# Patient Record
Sex: Female | Born: 1985 | Race: Black or African American | Hispanic: No | Marital: Single | Smoking: Former smoker
Health system: Southern US, Community
[De-identification: ages and names within clinical notes are randomized; demographics above are authoritative.]

## PROBLEM LIST (undated history)

## (undated) ENCOUNTER — Inpatient Hospital Stay (HOSPITAL_COMMUNITY): Payer: Self-pay

## (undated) DIAGNOSIS — R519 Headache, unspecified: Secondary | ICD-10-CM

## (undated) DIAGNOSIS — Z789 Other specified health status: Secondary | ICD-10-CM

## (undated) DIAGNOSIS — Z8619 Personal history of other infectious and parasitic diseases: Secondary | ICD-10-CM

## (undated) DIAGNOSIS — R51 Headache: Secondary | ICD-10-CM

## (undated) HISTORY — PX: NO PAST SURGERIES: SHX2092

## (undated) HISTORY — DX: Personal history of other infectious and parasitic diseases: Z86.19

## (undated) HISTORY — PX: OTHER SURGICAL HISTORY: SHX169

## (undated) HISTORY — DX: Headache, unspecified: R51.9

## (undated) HISTORY — DX: Headache: R51

---
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
## 2004-03-28 ENCOUNTER — Emergency Department (HOSPITAL_COMMUNITY): Admission: EM | Admit: 2004-03-28 | Discharge: 2004-03-28 | Payer: Self-pay | Admitting: Emergency Medicine

## 2005-06-02 ENCOUNTER — Emergency Department (HOSPITAL_COMMUNITY): Admission: EM | Admit: 2005-06-02 | Discharge: 2005-06-02 | Payer: Self-pay | Admitting: Family Medicine

## 2005-06-04 ENCOUNTER — Emergency Department (HOSPITAL_COMMUNITY): Admission: EM | Admit: 2005-06-04 | Discharge: 2005-06-04 | Payer: Self-pay | Admitting: Emergency Medicine

## 2005-12-22 ENCOUNTER — Inpatient Hospital Stay (HOSPITAL_COMMUNITY): Admission: AD | Admit: 2005-12-22 | Discharge: 2005-12-22 | Payer: Self-pay | Admitting: Obstetrics and Gynecology

## 2006-06-24 ENCOUNTER — Inpatient Hospital Stay (HOSPITAL_COMMUNITY): Admission: AD | Admit: 2006-06-24 | Discharge: 2006-06-27 | Payer: Self-pay | Admitting: Obstetrics and Gynecology

## 2007-09-08 ENCOUNTER — Emergency Department (HOSPITAL_COMMUNITY): Admission: EM | Admit: 2007-09-08 | Discharge: 2007-09-08 | Payer: Self-pay | Admitting: Family Medicine

## 2009-01-20 ENCOUNTER — Emergency Department (HOSPITAL_COMMUNITY): Admission: EM | Admit: 2009-01-20 | Discharge: 2009-01-21 | Payer: Self-pay | Admitting: Emergency Medicine

## 2009-10-11 ENCOUNTER — Inpatient Hospital Stay (HOSPITAL_COMMUNITY): Admission: AD | Admit: 2009-10-11 | Discharge: 2009-10-11 | Payer: Self-pay | Admitting: Family Medicine

## 2010-10-29 LAB — WET PREP, GENITAL
Trich, Wet Prep: NONE SEEN
Yeast Wet Prep HPF POC: NONE SEEN

## 2010-10-29 LAB — GC/CHLAMYDIA PROBE AMP, GENITAL
Chlamydia, DNA Probe: NEGATIVE
GC Probe Amp, Genital: NEGATIVE

## 2010-12-15 ENCOUNTER — Emergency Department (HOSPITAL_COMMUNITY): Payer: No Typology Code available for payment source

## 2010-12-15 ENCOUNTER — Emergency Department (HOSPITAL_COMMUNITY)
Admission: EM | Admit: 2010-12-15 | Discharge: 2010-12-16 | Disposition: A | Discharge status: Home / Self Care | Payer: No Typology Code available for payment source | Attending: Emergency Medicine | Admitting: Emergency Medicine

## 2010-12-15 DIAGNOSIS — IMO0002 Reserved for concepts with insufficient information to code with codable children: Secondary | ICD-10-CM | POA: Insufficient documentation

## 2010-12-15 DIAGNOSIS — Y998 Other external cause status: Secondary | ICD-10-CM | POA: Insufficient documentation

## 2010-12-15 DIAGNOSIS — M25519 Pain in unspecified shoulder: Secondary | ICD-10-CM | POA: Insufficient documentation

## 2010-12-15 DIAGNOSIS — Y9301 Activity, walking, marching and hiking: Secondary | ICD-10-CM | POA: Insufficient documentation

## 2010-12-15 DIAGNOSIS — S0990XA Unspecified injury of head, initial encounter: Secondary | ICD-10-CM | POA: Insufficient documentation

## 2010-12-15 DIAGNOSIS — M542 Cervicalgia: Secondary | ICD-10-CM | POA: Insufficient documentation

## 2010-12-15 DIAGNOSIS — R079 Chest pain, unspecified: Secondary | ICD-10-CM | POA: Insufficient documentation

## 2010-12-15 DIAGNOSIS — S40019A Contusion of unspecified shoulder, initial encounter: Secondary | ICD-10-CM | POA: Insufficient documentation

## 2010-12-15 LAB — DIFFERENTIAL
Basophils Absolute: 0 10*3/uL (ref 0.0–0.1)
Basophils Relative: 0 % (ref 0–1)
Lymphocytes Relative: 44 % (ref 12–46)
Neutro Abs: 2.6 10*3/uL (ref 1.7–7.7)
Neutrophils Relative %: 46 % (ref 43–77)

## 2010-12-15 LAB — CBC
HCT: 38.3 % (ref 36.0–46.0)
Hemoglobin: 13.4 g/dL (ref 12.0–15.0)
RBC: 4.78 MIL/uL (ref 3.87–5.11)
WBC: 5.7 10*3/uL (ref 4.0–10.5)

## 2010-12-15 LAB — COMPREHENSIVE METABOLIC PANEL
ALT: 11 U/L (ref 0–35)
AST: 26 U/L (ref 0–37)
Alkaline Phosphatase: 52 U/L (ref 39–117)
CO2: 26 mEq/L (ref 19–32)
Calcium: 9.1 mg/dL (ref 8.4–10.5)
Chloride: 104 mEq/L (ref 96–112)
GFR calc Af Amer: >60 mL/min (ref >60)
GFR calc non Af Amer: >60 mL/min (ref >60)
Potassium: 4.1 mEq/L (ref 3.5–5.1)
Sodium: 139 mEq/L (ref 135–145)
Total Bilirubin: 0.5 mg/dL (ref 0.3–1.2)

## 2010-12-16 LAB — URINALYSIS, ROUTINE W REFLEX MICROSCOPIC
Hgb urine dipstick: NEGATIVE
Nitrite: POSITIVE — AB
Specific Gravity, Urine: 1.026 (ref 1.005–1.030)
Urobilinogen, UA: 0.2 mg/dL (ref 0.0–1.0)
pH: 6.5 (ref 5.0–8.0)

## 2010-12-16 LAB — ABO/RH: ABO/RH(D): O POS

## 2010-12-16 LAB — URINE MICROSCOPIC-ADD ON

## 2010-12-16 MED ORDER — IOHEXOL 300 MG/ML  SOLN: 100.0000 mL | Freq: Once | INTRAMUSCULAR | Status: DC | PRN

## 2010-12-18 ENCOUNTER — Emergency Department (HOSPITAL_COMMUNITY)
Admission: EM | Admit: 2010-12-18 | Discharge: 2010-12-18 | Disposition: A | Discharge status: Home / Self Care | Payer: No Typology Code available for payment source | Attending: Emergency Medicine | Admitting: Emergency Medicine

## 2010-12-18 DIAGNOSIS — IMO0001 Reserved for inherently not codable concepts without codable children: Secondary | ICD-10-CM | POA: Insufficient documentation

## 2010-12-18 DIAGNOSIS — M549 Dorsalgia, unspecified: Secondary | ICD-10-CM | POA: Insufficient documentation

## 2010-12-18 DIAGNOSIS — M542 Cervicalgia: Secondary | ICD-10-CM | POA: Insufficient documentation

## 2010-12-18 DIAGNOSIS — IMO0002 Reserved for concepts with insufficient information to code with codable children: Secondary | ICD-10-CM | POA: Insufficient documentation

## 2010-12-18 DIAGNOSIS — D573 Sickle-cell trait: Secondary | ICD-10-CM | POA: Insufficient documentation

## 2010-12-18 DIAGNOSIS — T148XXA Other injury of unspecified body region, initial encounter: Secondary | ICD-10-CM | POA: Insufficient documentation

## 2010-12-18 DIAGNOSIS — C750 Malignant neoplasm of parathyroid gland: Secondary | ICD-10-CM | POA: Insufficient documentation

## 2010-12-22 NOTE — H&P (Signed)
NAMELAKECIA, [name]:  [id number]   MEDICAL RECORD NO.:  [id number]          PATIENT TYPE:  INP   LOCATION:                                FACILITY:  [facility]   PHYSICIAN:  Maxie Better, M.D.DATE OF BIRTH:  October 24, 1985   DATE OF ADMISSION:  06/24/2006  DATE OF DISCHARGE:                              HISTORY & PHYSICAL   CHIEF COMPLAINT:  Oligohydramnios, post dates, induction of labor.   HISTORY OF PRESENT ILLNESS:  This is a 25 year old gravida 1, para 0,  single, black female at 40-2/7 weeks' gestation being admitted for  induction of labor secondary to oligohydramnios and post dates.  The  patient's prenatal care has been uncomplicated.  She underwent an  ultrasound today which revealed oligohydramnios.  Estimated fetal weight  of 8 pounds 15 ounces.  The patient is Group B strep positive.  Prenatal  care is at [facility].  Primary obstetrician, Carrie Krueger.   PRENATAL LABS:  Blood type is O  positive.  Hemoglobin electrophoresis  consistent with sickle cell trait.  Hepatitis B surface antigen was  negative.  Rubella is immune.  Antibody screen was negative.  RPR is  nonreactive.  Group B strep is positive.  Quadruple screen was normal.  Normal one-hour glucose challenge test at 78.   PAST MEDICAL HISTORY:  No known drug allergies.   MEDICINES:  Prenatal vitamins.   MEDICAL HISTORY:  Sickle cell trait.   SURGICAL HISTORY:  Negative.   FAMILY HISTORY:  Noncontributory.   SOCIAL HISTORY:  Single, nonsmoker.   REVIEW OF SYSTEMS:  Negative.   PHYSICAL EXAMINATION:  Well-developed, well-nourished, gravid, black  female in no acute distress.  VITAL SIGNS:  Blood pressure 120/70.  Weight is 179.2 pounds.  SKIN:  No lesions.  HEENT:  Anicteric sclerae.  Pink conjunctivae.  Oropharynx negative.  HEART:  Regular rate and rhythm without murmur.  LUNGS:  Clear to auscultation.  BREASTS:  Soft, nontender.  ABDOMEN:  Gravid.  Fundal height 40  cm.  PELVIC:  Fingertip about 50% effaced, -2, vertex presentation.  EXTREMITIES:  Trace edema.   IMPRESSION:  1. Oligohydramnios.  2. Post dates.  3. Sickle cell trait.  4. Group B streptococcal culture positive.   PLAN:  Admission.  Routine labs.  Cervidil for ripening of the cervix.  Low-dose Pitocin in the a.m.  Analgesics p.r.n. Penicillin antibiotics  prophylaxis      Maxie Better, M.D.  Electronically Signed     [place]/[facility]  D:  06/24/2006  T:  06/25/2006  Job:  [id number]

## 2010-12-25 ENCOUNTER — Emergency Department (HOSPITAL_COMMUNITY)
Admission: EM | Admit: 2010-12-25 | Discharge: 2010-12-25 | Discharge status: Against Medical Advice | Payer: No Typology Code available for payment source | Attending: Emergency Medicine | Admitting: Emergency Medicine

## 2010-12-25 DIAGNOSIS — Z0389 Encounter for observation for other suspected diseases and conditions ruled out: Secondary | ICD-10-CM | POA: Insufficient documentation

## 2011-01-05 ENCOUNTER — Ambulatory Visit: Payer: No Typology Code available for payment source | Attending: Orthopaedic Surgery

## 2011-01-05 DIAGNOSIS — M2550 Pain in unspecified joint: Secondary | ICD-10-CM | POA: Insufficient documentation

## 2011-01-05 DIAGNOSIS — M2560 Stiffness of unspecified joint, not elsewhere classified: Secondary | ICD-10-CM | POA: Insufficient documentation

## 2011-01-05 DIAGNOSIS — M6281 Muscle weakness (generalized): Secondary | ICD-10-CM | POA: Insufficient documentation

## 2011-01-05 DIAGNOSIS — R262 Difficulty in walking, not elsewhere classified: Secondary | ICD-10-CM | POA: Insufficient documentation

## 2011-01-05 DIAGNOSIS — IMO0001 Reserved for inherently not codable concepts without codable children: Secondary | ICD-10-CM | POA: Insufficient documentation

## 2011-01-10 ENCOUNTER — Ambulatory Visit: Payer: No Typology Code available for payment source | Admitting: Physical Therapy

## 2011-01-15 ENCOUNTER — Ambulatory Visit: Payer: No Typology Code available for payment source | Admitting: Physical Therapy

## 2011-01-17 ENCOUNTER — Ambulatory Visit: Payer: No Typology Code available for payment source | Admitting: Physical Therapy

## 2011-01-19 ENCOUNTER — Ambulatory Visit: Payer: No Typology Code available for payment source

## 2011-01-22 ENCOUNTER — Ambulatory Visit: Payer: No Typology Code available for payment source

## 2011-01-24 ENCOUNTER — Ambulatory Visit: Payer: No Typology Code available for payment source | Admitting: Physical Therapy

## 2011-01-26 ENCOUNTER — Ambulatory Visit: Payer: No Typology Code available for payment source | Admitting: Physical Therapy

## 2011-02-05 ENCOUNTER — Ambulatory Visit: Payer: No Typology Code available for payment source | Attending: Orthopaedic Surgery

## 2011-02-05 DIAGNOSIS — R262 Difficulty in walking, not elsewhere classified: Secondary | ICD-10-CM | POA: Insufficient documentation

## 2011-02-05 DIAGNOSIS — IMO0001 Reserved for inherently not codable concepts without codable children: Secondary | ICD-10-CM | POA: Insufficient documentation

## 2011-02-05 DIAGNOSIS — M2550 Pain in unspecified joint: Secondary | ICD-10-CM | POA: Insufficient documentation

## 2011-02-05 DIAGNOSIS — M6281 Muscle weakness (generalized): Secondary | ICD-10-CM | POA: Insufficient documentation

## 2011-02-05 DIAGNOSIS — M2560 Stiffness of unspecified joint, not elsewhere classified: Secondary | ICD-10-CM | POA: Insufficient documentation

## 2011-02-14 ENCOUNTER — Ambulatory Visit: Payer: No Typology Code available for payment source

## 2011-02-22 ENCOUNTER — Ambulatory Visit: Payer: No Typology Code available for payment source

## 2011-02-26 ENCOUNTER — Ambulatory Visit: Payer: No Typology Code available for payment source

## 2011-02-28 ENCOUNTER — Ambulatory Visit: Payer: No Typology Code available for payment source

## 2011-03-07 ENCOUNTER — Ambulatory Visit: Payer: No Typology Code available for payment source | Attending: Orthopaedic Surgery

## 2011-03-07 DIAGNOSIS — M6281 Muscle weakness (generalized): Secondary | ICD-10-CM | POA: Insufficient documentation

## 2011-03-07 DIAGNOSIS — M2560 Stiffness of unspecified joint, not elsewhere classified: Secondary | ICD-10-CM | POA: Insufficient documentation

## 2011-03-07 DIAGNOSIS — IMO0001 Reserved for inherently not codable concepts without codable children: Secondary | ICD-10-CM | POA: Insufficient documentation

## 2011-03-07 DIAGNOSIS — M2550 Pain in unspecified joint: Secondary | ICD-10-CM | POA: Insufficient documentation

## 2011-03-07 DIAGNOSIS — R262 Difficulty in walking, not elsewhere classified: Secondary | ICD-10-CM | POA: Insufficient documentation

## 2011-03-09 ENCOUNTER — Ambulatory Visit: Payer: No Typology Code available for payment source

## 2011-03-12 ENCOUNTER — Ambulatory Visit: Payer: No Typology Code available for payment source | Admitting: Rehabilitative and Restorative Service Providers"

## 2011-03-21 ENCOUNTER — Ambulatory Visit: Payer: No Typology Code available for payment source

## 2011-03-23 ENCOUNTER — Ambulatory Visit: Payer: No Typology Code available for payment source

## 2011-03-26 ENCOUNTER — Ambulatory Visit: Payer: No Typology Code available for payment source

## 2011-03-28 ENCOUNTER — Ambulatory Visit: Payer: No Typology Code available for payment source

## 2011-04-06 ENCOUNTER — Ambulatory Visit: Payer: No Typology Code available for payment source

## 2011-04-11 ENCOUNTER — Ambulatory Visit: Payer: No Typology Code available for payment source | Attending: Orthopaedic Surgery

## 2011-04-11 DIAGNOSIS — IMO0001 Reserved for inherently not codable concepts without codable children: Secondary | ICD-10-CM | POA: Insufficient documentation

## 2011-04-11 DIAGNOSIS — R262 Difficulty in walking, not elsewhere classified: Secondary | ICD-10-CM | POA: Insufficient documentation

## 2011-04-11 DIAGNOSIS — M2560 Stiffness of unspecified joint, not elsewhere classified: Secondary | ICD-10-CM | POA: Insufficient documentation

## 2011-04-11 DIAGNOSIS — M6281 Muscle weakness (generalized): Secondary | ICD-10-CM | POA: Insufficient documentation

## 2011-04-11 DIAGNOSIS — M2550 Pain in unspecified joint: Secondary | ICD-10-CM | POA: Insufficient documentation

## 2012-05-12 ENCOUNTER — Inpatient Hospital Stay (HOSPITAL_COMMUNITY)
Admission: AD | Admit: 2012-05-12 | Discharge: 2012-05-13 | Disposition: A | Discharge status: Home / Self Care | Payer: Self-pay | Source: Ambulatory Visit | Attending: Obstetrics & Gynecology | Admitting: Obstetrics & Gynecology

## 2012-05-12 DIAGNOSIS — N390 Urinary tract infection, site not specified: Secondary | ICD-10-CM

## 2012-05-12 DIAGNOSIS — R109 Unspecified abdominal pain: Secondary | ICD-10-CM | POA: Insufficient documentation

## 2012-05-12 DIAGNOSIS — N938 Other specified abnormal uterine and vaginal bleeding: Secondary | ICD-10-CM | POA: Insufficient documentation

## 2012-05-12 DIAGNOSIS — N949 Unspecified condition associated with female genital organs and menstrual cycle: Secondary | ICD-10-CM | POA: Insufficient documentation

## 2012-05-12 DIAGNOSIS — Z30431 Encounter for routine checking of intrauterine contraceptive device: Secondary | ICD-10-CM | POA: Insufficient documentation

## 2012-05-12 DIAGNOSIS — Z975 Presence of (intrauterine) contraceptive device: Secondary | ICD-10-CM

## 2012-05-12 NOTE — MAU Note (Signed)
Pt has mirena IUD, passed bright red blood with a white mucous yesterday.  Pt having lower abd pain today.

## 2012-05-13 ENCOUNTER — Inpatient Hospital Stay (HOSPITAL_COMMUNITY): Payer: Self-pay

## 2012-05-13 LAB — WET PREP, GENITAL: Yeast Wet Prep HPF POC: NONE SEEN

## 2012-05-13 LAB — URINALYSIS, ROUTINE W REFLEX MICROSCOPIC
Glucose, UA: NEGATIVE mg/dL
Hgb urine dipstick: NEGATIVE
Ketones, ur: NEGATIVE mg/dL
Protein, ur: NEGATIVE mg/dL

## 2012-05-13 LAB — URINE MICROSCOPIC-ADD ON

## 2012-05-13 LAB — POCT PREGNANCY, URINE: Preg Test, Ur: NEGATIVE

## 2012-05-13 MED ORDER — SULFAMETHOXAZOLE-TRIMETHOPRIM 800-160 MG PO TABS: 1.0000 | ORAL_TABLET | Freq: Two times a day (BID) | ORAL | Status: DC

## 2012-05-13 NOTE — MAU Note (Signed)
SAYS HAS IUD IN  - INSERTED IN 2008-  AFTER 6 WEEK CHECKUP. BY HD .       SAYS VAG BLEEDING STARTED ON Sunday AT 230PM.     NOW IN B-ROOM- NOTHING ON PAPER   SAYS LOWER BACK HURTS - STARTED  MON AM.  LAST SEX- SAT- USED CONDOMS.

## 2012-05-13 NOTE — MAU Provider Note (Signed)
History     CSN: [id number]  Arrival date and time: 05/12/12 2258   None     Chief Complaint  Patient presents with  . Vaginal Bleeding   HPI Carrie Krueger is a 26 y.o. female who presents to MAU with vaginal bleeding. The bleeding started yesterday. Only one episode. Heavier than a period. Low abdominal cramping. Last sexual intercourse 2 days ago and was not painful. Patient concerned that the IUD may not be in correct place. Has never had bleeding since IUD place other than spotting occasionally.  History  Substance Use Topics  . Smoking status: Not on file  . Smokeless tobacco: Not on file  . Alcohol Use: Not on file    Allergies: Allergies not on file  No prescriptions prior to admission    Review of Systems  Constitutional: Negative for fever, chills and weight loss.  HENT: Negative for ear pain, nosebleeds, congestion, sore throat and neck pain.   Eyes: Negative for blurred vision, double vision, photophobia and pain.  Respiratory: Negative for cough, shortness of breath and wheezing.   Cardiovascular: Negative for chest pain, palpitations and leg swelling.  Gastrointestinal: Negative for heartburn, nausea, vomiting, abdominal pain (mild cramping), diarrhea and constipation.  Genitourinary: Negative for dysuria, urgency and frequency.       Vaginal bleeding  Musculoskeletal: Negative for myalgias and back pain.  Skin: Negative for itching and rash.  Neurological: Negative for dizziness, sensory change, speech change, seizures, weakness and headaches.  Endo/Heme/Allergies: Does not bruise/bleed easily.  Psychiatric/Behavioral: Negative for depression. The patient is not nervous/anxious.    Physical Exam   Blood pressure 125/71, pulse 89, temperature 98.2 F (36.8 C), temperature source Oral, resp. rate 16, height 5\' 5"  (1.651 m), weight 164 lb 6.4 oz (74.571 kg).  Physical Exam  Nursing note and vitals reviewed. Constitutional: She is oriented to person, place,  and time. She appears well-developed and well-nourished. No distress.  HENT:  Head: Normocephalic and atraumatic.  Eyes: EOM are normal.  Neck: Neck supple.  Cardiovascular: Normal rate.   Respiratory: Effort normal.  GI: Soft. There is no tenderness.  Genitourinary:       External genitalia without lesions. White discharge vaginal vault. No blood noted. Cervix closed, IUD string visible, no CMT, no adnexal tenderness. Uterus without palpable enlargement.  Musculoskeletal: Normal range of motion.  Neurological: She is alert and oriented to person, place, and time.  Skin: Skin is warm and dry.  Psychiatric: She has a normal mood and affect. Her behavior is normal. Judgment and thought content normal.   Results for orders placed during the hospital encounter of 05/12/12 (from the past 24 hour(s))  URINALYSIS, ROUTINE W REFLEX MICROSCOPIC     Status: Abnormal   Collection Time   05/12/12 11:30 PM      Component Value Range   Color, Urine YELLOW  YELLOW   APPearance CLEAR  CLEAR   Specific Gravity, Urine 1.025  1.005 - 1.030   pH 6.0  5.0 - 8.0   Glucose, UA NEGATIVE  NEGATIVE mg/dL   Hgb urine dipstick NEGATIVE  NEGATIVE   Bilirubin Urine NEGATIVE  NEGATIVE   Ketones, ur NEGATIVE  NEGATIVE mg/dL   Protein, ur NEGATIVE  NEGATIVE mg/dL   Urobilinogen, UA 0.2  0.0 - 1.0 mg/dL   Nitrite POSITIVE (*) NEGATIVE   Leukocytes, UA NEGATIVE  NEGATIVE  URINE MICROSCOPIC-ADD ON     Status: Abnormal   Collection Time   05/12/12 11:30 PM  Component Value Range   Squamous Epithelial / LPF FEW (*) RARE   WBC, UA 3-6  <3 WBC/hpf   Bacteria, UA MANY (*) RARE   Urine-Other MUCOUS PRESENT    POCT PREGNANCY, URINE     Status: Normal   Collection Time   05/13/12  2:40 AM      Component Value Range   Preg Test, Ur NEGATIVE  NEGATIVE  WET PREP, GENITAL     Status: Abnormal   Collection Time   05/13/12  3:05 AM      Component Value Range   Yeast Wet Prep HPF POC NONE SEEN  NONE SEEN   Trich, Wet  Prep NONE SEEN  NONE SEEN   Clue Cells Wet Prep HPF POC FEW (*) NONE SEEN   WBC, Wet Prep HPF POC FEW (*) NONE SEEN   Procedures US Transvaginal Non-ob  05/13/2012  *RADIOLOGY REPORT*  Clinical Data: 26 year old female with pain and bleeding.  History of IUD.  LMP 05/12/2012.  TRANSABDOMINAL AND TRANSVAGINAL ULTRASOUND OF PELVIS Technique:  Both transabdominal and transvaginal ultrasound examinations of the pelvis were performed. Transabdominal technique was performed for global imaging of the pelvis including uterus, ovaries, adnexal regions, and pelvic cul-de-sac.  It was necessary to proceed with endovaginal exam following the transabdominal exam to visualize the adnexa.  Comparison:  CT pelvis 12/16/2010.  Findings:  Uterus: Normal measuring 8.9 x 4.5 x 5.1 cm.  Endometrium: Hyperechoic shadowing IUD visible.  Endometrium appears diminutive.  Right ovary:  Normal with small follicles.  4.9 x 2.4 x 2.3 cm.  Left ovary: Normal with less follicles than on the right.  5.3 x 2.5 x 2.2 cm.  Other findings: No free fluid.  IMPRESSION: Normal.  IUD in place.   Original Report Authenticated By: Harley Hallmark, M.D.    US Pelvis Complete  05/13/2012  *RADIOLOGY REPORT*  Clinical Data: 26 year old female with pain and bleeding.  History of IUD.  LMP 05/12/2012.  TRANSABDOMINAL AND TRANSVAGINAL ULTRASOUND OF PELVIS Technique:  Both transabdominal and transvaginal ultrasound examinations of the pelvis were performed. Transabdominal technique was performed for global imaging of the pelvis including uterus, ovaries, adnexal regions, and pelvic cul-de-sac.  It was necessary to proceed with endovaginal exam following the transabdominal exam to visualize the adnexa.  Comparison:  CT pelvis 12/16/2010.  Findings:  Uterus: Normal measuring 8.9 x 4.5 x 5.1 cm.  Endometrium: Hyperechoic shadowing IUD visible.  Endometrium appears diminutive.  Right ovary:  Normal with small follicles.  4.9 x 2.4 x 2.3 cm.  Left ovary: Normal  with less follicles than on the right.  5.3 x 2.5 x 2.2 cm.  Other findings: No free fluid.  IMPRESSION: Normal.  IUD in place.   Original Report Authenticated By: Harley Hallmark, M.D.    Assessment: 26 y.o. female with UTI   IUD in correct position  Plan:  Treat UTI   Follow up with [facility], return here as needed.  I have reviewed this patient's vital signs, nurses notes, appropriate labs and imaging. I have discussed results with patient and plan of care and patient voices understanding.   Medication List     As of 05/19/2012 12:57 AM    START taking these medications         sulfamethoxazole-trimethoprim 800-160 MG per tablet   Commonly known as: BACTRIM DS,SEPTRA DS   Take 1 tablet by mouth 2 (two) times daily.          Where to get  your medications    These are the prescriptions that you need to pick up.   You may get these medications from any pharmacy.         sulfamethoxazole-trimethoprim 800-160 MG per tablet            NEESE,HOPE, RN, FNP, [facility] 05/13/2012, 2:42 AM

## 2012-05-14 LAB — GC/CHLAMYDIA PROBE AMP, GENITAL: Chlamydia, DNA Probe: NEGATIVE

## 2012-05-19 NOTE — MAU Provider Note (Signed)
Medical Screening exam and patient care preformed by advanced practice provider.  Agree with the above management.  

## 2012-05-20 ENCOUNTER — Emergency Department (HOSPITAL_COMMUNITY)
Admission: EM | Admit: 2012-05-20 | Discharge: 2012-05-20 | Disposition: A | Discharge status: Home / Self Care | Payer: Self-pay | Attending: Emergency Medicine | Admitting: Emergency Medicine

## 2012-05-20 ENCOUNTER — Encounter (HOSPITAL_COMMUNITY): Payer: Self-pay | Admitting: Emergency Medicine

## 2012-05-20 DIAGNOSIS — Z888 Allergy status to other drugs, medicaments and biological substances status: Secondary | ICD-10-CM | POA: Insufficient documentation

## 2012-05-20 DIAGNOSIS — F17200 Nicotine dependence, unspecified, uncomplicated: Secondary | ICD-10-CM | POA: Insufficient documentation

## 2012-05-20 DIAGNOSIS — T7840XA Allergy, unspecified, initial encounter: Secondary | ICD-10-CM

## 2012-05-20 DIAGNOSIS — R21 Rash and other nonspecific skin eruption: Secondary | ICD-10-CM | POA: Insufficient documentation

## 2012-05-20 DIAGNOSIS — T370X5A Adverse effect of sulfonamides, initial encounter: Secondary | ICD-10-CM | POA: Insufficient documentation

## 2012-05-20 DIAGNOSIS — Z91018 Allergy to other foods: Secondary | ICD-10-CM | POA: Insufficient documentation

## 2012-05-20 MED ORDER — HYDROXYZINE HCL 25 MG PO TABS: 25.0000 mg | ORAL_TABLET | Freq: Four times a day (QID) | ORAL | Status: DC

## 2012-05-20 MED ORDER — DEXAMETHASONE SODIUM PHOSPHATE 10 MG/ML IJ SOLN
10.0000 mg | Freq: Once | INTRAMUSCULAR | Status: AC
  Administered 2012-05-20: 10 mg via INTRAMUSCULAR
  Filled 2012-05-20: qty 1

## 2012-05-20 MED ORDER — DIPHENHYDRAMINE HCL 50 MG/ML IJ SOLN
25.0000 mg | Freq: Once | INTRAMUSCULAR | Status: AC
  Administered 2012-05-20: 25 mg via INTRAMUSCULAR
  Filled 2012-05-20: qty 1

## 2012-05-20 NOTE — ED Notes (Signed)
Had sob yesterday but better today

## 2012-05-20 NOTE — ED Notes (Signed)
[facility]  Had a uti was given antibiotics noticed that she had hives neck back and chest stopped takng med last night is itching

## 2012-05-20 NOTE — Discharge Instructions (Signed)
Apply topical hydrocortisone ointment as needed. Return to the emergency room or call 911 if you have any swelling in the lips or tongue or any shortness of breath. You are allergic to Bactrim so please inform all of her health care providers that you have an allergy to Bactrim and possibly sulfa.  Drug Allergy Allergic reactions to medicines are common. Some allergic reactions are mild. A delayed type of drug allergy that occurs 1 week or more after exposure to a medicine or vaccine is called serum sickness. A life-threatening, sudden (acute) allergic reaction that involves the whole body is called anaphylaxis. CAUSES  "True" drug allergies occur when there is an allergic reaction to a medicine. This is caused by overactivity of the immune system. First, the body becomes sensitized. The immune system is triggered by your first exposure to the medicine. Following this first exposure, future exposure to the same medicine may be life-threatening. Almost any medicine can cause an allergic reaction. Common ones are:  Penicillin.  Sulfonamides (sulfa drugs).  Local anesthetics.  X-ray dyes that contain iodine. SYMPTOMS  Common symptoms of a minor allergic reaction are:  Swelling around the mouth.  An itchy red rash or hives.  Vomiting or diarrhea. Anaphylaxis can cause swelling of the mouth and throat. This makes it difficult to breathe and swallow. Severe reactions can be fatal within seconds, even after exposure to only a trace amount of the drug that causes the reaction. HOME CARE INSTRUCTIONS   If you are unsure of what caused your reaction, keep a diary of foods and medicines used. Include the symptoms that followed. Avoid anything that causes reactions.  You may want to follow up with an allergy specialist after the reaction has cleared in order to be tested to confirm the allergy. It is important to confirm that your reaction is an allergy, not just a side effect to the medicine. If you  have a true allergy to a medicine, this may prevent that medicine and related medicines from being given to you when you are very ill.  If you have hives or a rash:  Take medicines as directed by your caregiver.  You may use an over-the-counter antihistamine (diphenhydramine) as needed.  Apply cold compresses to the skin or take baths in cool water. Avoid hot baths or showers.  If you are severely allergic:  Continuous observation after a severe reaction may be needed. Hospitalization is often required.  Wear a medical alert bracelet or necklace stating your allergy.  You and your family must learn how to use an anaphylaxis kit or give an epinephrine injection to temporarily treat an emergency allergic reaction. If you have had a severe reaction, always carry your epinephrine injection or anaphylaxis kit with you. This can be lifesaving if you have a severe reaction.  Do not drive or perform tasks after treatment until the medicines used to treat your reaction have worn off, or until your caregiver says it is okay. SEEK MEDICAL CARE IF:   You think you had an allergic reaction. Symptoms usually start within 30 minutes after exposure.  Symptoms are getting worse rather than better.  You develop new symptoms.  The symptoms that brought you to your caregiver return. SEEK IMMEDIATE MEDICAL CARE IF:   You have swelling of the mouth, difficulty breathing, or wheezing.  You have a tight feeling in your chest or throat.  You develop hives, swelling, or itching all over your body.  You develop severe vomiting or diarrhea.  You  feel faint or pass out. This is an emergency. Use your epinephrine injection or anaphylaxis kit as you have been instructed. Call for emergency medical help. Even if you improve after the injection, you need to be examined at a hospital emergency department. MAKE SURE YOU:   Understand these instructions.  Will watch your condition.  Will get help right  away if you are not doing well or get worse. Document Released: 07/23/2005 Document Revised: 10/15/2011 Document Reviewed: 12/27/2010 [facility] Patient Information 2013 [place], [place].

## 2012-05-21 NOTE — ED Provider Notes (Signed)
History     CSN: [id number]  Arrival date & time 05/20/12  1003   First MD Initiated Contact with Patient 05/20/12 1043      Chief Complaint  Patient presents with  . Rash    (Consider location/radiation/quality/duration/timing/severity/associated sxs/prior treatment) HPI  26 y.o. female in no acute distress was seen at [facility] hospital last week for abnormal vaginal bleeding was told she had a UTI and started on Bactrim. She is on the six-day out of 7 Bactrim. She reports a pruritic rash to chest, bilateral proximal arm. Patient denies any shortness of breath, tongue swelling, abdominal upset.  History reviewed. No pertinent past medical history.  No past surgical history on file.  No family history on file.  History  Substance Use Topics  . Smoking status: Current Every Day Smoker  . Smokeless tobacco: Not on file  . Alcohol Use: Yes    OB History    Grav Para Term Preterm Abortions TAB SAB Ect Mult Living                  Review of Systems  Constitutional: Negative for fever.  Respiratory: Negative for shortness of breath.   Cardiovascular: Negative for chest pain.  Gastrointestinal: Negative for nausea, vomiting, abdominal pain and diarrhea.  Skin: Positive for rash.  All other systems reviewed and are negative.    Allergies  Bactrim and Tomato  Home Medications   Current Outpatient Rx  Name Route Sig Dispense Refill  . SULFAMETHOXAZOLE-TMP DS 800-160 MG PO TABS Oral Take 1 tablet by mouth 2 (two) times daily. Starting 05/13/12 for 7 days    . HYDROXYZINE HCL 25 MG PO TABS Oral Take 1 tablet (25 mg total) by mouth every 6 (six) hours. 20 tablet 0    BP 107/67  Pulse 86  Temp 98.1 F (36.7 C)  Resp 16  SpO2 100%  Physical Exam  Nursing note and vitals reviewed. Constitutional: She is oriented to person, place, and time. She appears well-developed and well-nourished. No distress.  HENT:  Head: Normocephalic.  Mouth/Throat: Oropharynx is clear  and moist.       No oropharyngeal erythema or edema  Eyes: Conjunctivae normal and EOM are normal. Pupils are equal, round, and reactive to light.  Cardiovascular: Normal rate and regular rhythm.   Pulmonary/Chest: Breath sounds normal. No stridor. No respiratory distress. She has no wheezes. She has no rales. She exhibits no tenderness.       Good air movement in all fields, no respiratory distress, patient is speaking in full sentences with no accessory muscle use.  Abdominal: Soft. Bowel sounds are normal.  Musculoskeletal: Normal range of motion.  Neurological: She is alert and oriented to person, place, and time.  Psychiatric: She has a normal mood and affect.    ED Course  Procedures (including critical care time)  Labs Reviewed - No data to display No results found.   1. Allergic reaction caused by a drug       MDM  Patient was likely allergic reaction to Bactrim. She is asymptomatic TIA and has completed 6/7 days I think it is reasonable to see her antibiotics at this point. I will give the patient 10 mg of Decadron and 25 mg of Benadryl IM. Patient will be given a prescription for Atarax.   Pt verbalized understanding and agrees with care plan. Outpatient follow-up and return precautions given.     New Prescriptions   HYDROXYZINE (ATARAX/VISTARIL) 25 MG TABLET  Take 1 tablet (25 mg total) by mouth every 6 (six) hours.          Wynetta Emery, PA-C 05/21/12 1300

## 2012-05-24 NOTE — ED Provider Notes (Signed)
Medical screening examination/treatment/procedure(s) were performed by non-physician practitioner and as supervising physician I was immediately available for consultation/collaboration.  Hurman Horn, MD 05/24/12 [phone]

## 2012-07-13 ENCOUNTER — Emergency Department (HOSPITAL_COMMUNITY)
Admission: EM | Admit: 2012-07-13 | Discharge: 2012-07-13 | Disposition: A | Discharge status: Home / Self Care | Payer: Self-pay | Attending: Emergency Medicine | Admitting: Emergency Medicine

## 2012-07-13 ENCOUNTER — Encounter (HOSPITAL_COMMUNITY): Payer: Self-pay | Admitting: Emergency Medicine

## 2012-07-13 DIAGNOSIS — R05 Cough: Secondary | ICD-10-CM | POA: Insufficient documentation

## 2012-07-13 DIAGNOSIS — R42 Dizziness and giddiness: Secondary | ICD-10-CM | POA: Insufficient documentation

## 2012-07-13 DIAGNOSIS — R1310 Dysphagia, unspecified: Secondary | ICD-10-CM | POA: Insufficient documentation

## 2012-07-13 DIAGNOSIS — R0602 Shortness of breath: Secondary | ICD-10-CM | POA: Insufficient documentation

## 2012-07-13 DIAGNOSIS — F17200 Nicotine dependence, unspecified, uncomplicated: Secondary | ICD-10-CM | POA: Insufficient documentation

## 2012-07-13 DIAGNOSIS — J020 Streptococcal pharyngitis: Secondary | ICD-10-CM | POA: Insufficient documentation

## 2012-07-13 DIAGNOSIS — R059 Cough, unspecified: Secondary | ICD-10-CM | POA: Insufficient documentation

## 2012-07-13 LAB — RAPID STREP SCREEN (MED CTR MEBANE ONLY): Streptococcus, Group A Screen (Direct): POSITIVE — AB

## 2012-07-13 MED ORDER — DEXAMETHASONE 2 MG PO TABS
6.0000 mg | ORAL_TABLET | Freq: Once | ORAL | Status: AC
  Administered 2012-07-13: 6 mg via ORAL
  Filled 2012-07-13: qty 3

## 2012-07-13 MED ORDER — PENICILLIN G BENZATHINE 1200000 UNIT/2ML IM SUSP
1.2000 10*6.[IU] | Freq: Once | INTRAMUSCULAR | Status: AC
  Administered 2012-07-13: 1.2 10*6.[IU] via INTRAMUSCULAR
  Filled 2012-07-13: qty 2

## 2012-07-13 MED ORDER — HYDROCODONE-ACETAMINOPHEN 5-325 MG PO TABS: 1.0000 | ORAL_TABLET | Freq: Four times a day (QID) | ORAL | Status: DC | PRN

## 2012-07-13 MED ORDER — IBUPROFEN 400 MG PO TABS
800.0000 mg | ORAL_TABLET | Freq: Once | ORAL | Status: AC
  Administered 2012-07-13: 800 mg via ORAL
  Filled 2012-07-13: qty 2

## 2012-07-13 MED ORDER — IBUPROFEN 600 MG PO TABS: 600.0000 mg | ORAL_TABLET | Freq: Four times a day (QID) | ORAL | Status: DC | PRN

## 2012-07-13 MED ORDER — DYCLONINE HCL 3 MG MT LOZG: 1.0000 | LOZENGE | OROMUCOSAL | Status: DC | PRN

## 2012-07-13 MED ORDER — BENZOCAINE-MENTHOL 15-10 MG MT LOZG: 1.0000 | LOZENGE | OROMUCOSAL | Status: DC | PRN

## 2012-07-13 NOTE — ED Notes (Signed)
Pt. Stated, I started having sore throat on Friday.

## 2012-07-13 NOTE — ED Provider Notes (Signed)
History   This chart was scribed for Jones Skene, MD by Melba Coon, ED Scribe. The patient was seen in room TR07C/TR07C and the patient's care was started at 11:30AM.    CSN: [id number]  Arrival date & time 07/13/12  1119   First MD Initiated Contact with Patient 07/13/12 1127      Chief Complaint  Patient presents with  . Sore Throat    (Consider location/radiation/quality/duration/timing/severity/associated sxs/prior treatment) The history is provided by the patient. No language interpreter was used.   Carrie Krueger is a 26 y.o. female who presents to the Emergency Department complaining of persistent, moderate to severe sore throat with an onset 2 days ago that has gotten progressively worse. She reports decreased air movement and SOB at home due to swelling of her throat and reports difficulty swallowing. She reports intermittent coughing due to phlegm clearance from the throat. She reports intermittent HA behind the eyes.. Halls cough drops did not alleviate the symptoms. She reports back pain. Denies fever, rash, CP, abdominal pain, nausea, emesis, diarrhea, dysuria, or extremity pain, edema, weakness, numbness, or tingling. Allergic to bactrim. No other pertinent medical symptoms. She is a current everyday smoker.  History reviewed. No pertinent past medical history.  History reviewed. No pertinent past surgical history.  No family history on file.  History  Substance Use Topics  . Smoking status: Current Every Day Smoker  . Smokeless tobacco: Not on file  . Alcohol Use: Yes    OB History    Grav Para Term Preterm Abortions TAB SAB Ect Mult Living                  Review of Systems 10 Systems reviewed and are negative for acute change except as noted in the HPI.  Allergies  Bactrim and Tomato  Home Medications   Current Outpatient Rx  Name  Route  Sig  Dispense  Refill  . LEVONORGESTREL 20 MCG/24HR IU IUD   Intrauterine   1 each by Intrauterine route  once.           BP 112/66  Pulse 92  Temp 98.2 F (36.8 C) (Oral)  Resp 16  SpO2 99%  Physical Exam  Nursing notes reviewed.  Electronic medical record reviewed. VITAL SIGNS:   Filed Vitals:   07/13/12 1122  BP: 112/66  Pulse: 92  Temp: 98.2 F (36.8 C)  TempSrc: Oral  Resp: 16  SpO2: 99%   CONSTITUTIONAL: Awake, oriented, appears non-toxic HENT: Atraumatic, normocephalic, oral mucosa pink and moist, airway patent. 3+ tonsils, yellowish white exudate bilaterally, erythema to posterior pharynx - equilateral swelling, voice sounds normal, handling secretions without drooling no trismus. Nares patent without drainage. External ears normal. EYES: Conjunctiva clear, EOMI, PERRLA NECK: Trachea midline, mild anterior cervical lymphadenopathy, supple CARDIOVASCULAR: Normal heart rate, Normal rhythm, No murmurs, rubs, gallops PULMONARY/CHEST: Clear to auscultation, no rhonchi, wheezes, or rales. Symmetrical breath sounds. Non-tender. ABDOMINAL: Non-distended, soft, non-tender - no rebound or guarding.  BS normal. NEUROLOGIC: Non-focal, moving all four extremities, no gross sensory or motor deficits. EXTREMITIES: No clubbing, cyanosis, or edema SKIN: Warm, Dry, No erythema, No rash  ED Course  Procedures (including critical care time)  COORDINATION OF CARE:  11:41AM - rapid strep screen and vicodin will be ordered for Carrie Krueger. Steroids and ibuprofen will be Rx for her. 12:21PM - Strep test results reviewed and is positive. Penicillin, cough drops, and decadron will be Rx for Carrie Krueger. She is ready for d/c.  Labs Reviewed  RAPID STREP SCREEN - Abnormal; Notable for the following:    Streptococcus, Group A Screen (Direct) POSITIVE (*)     All other components within normal limits   No results found.   1. Streptococcal pharyngitis      MDM  Carrie Krueger is a 26 y.o. female presenting with streptococcal pharyngitis. Patient had a couple days history of this, it  is worsened, she prefers to be treated with microvillous, discussed risks and benefits of this strategy, we'll give IM injection of penicillin and also give her a dose of Decadron due to swelling - she should like to return to work tomorrow. Will put patient on Motrin as well as 2 separate anesthetic throat lozenges, also give her a short prescription for Norco.  I explained the diagnosis and have given explicit precautions to return to the ER including any other new or worsening symptoms. The patient understands and accepts the medical plan as it's been dictated and I have answered their questions. Discharge instructions concerning home care and prescriptions have been given.  The patient is STABLE and is discharged to home in good condition.  I personally performed the services described in this documentation, which was scribed in my presence. The recorded information has been reviewed and is accurate.    Jones Skene, MD 07/13/12 1235

## 2013-06-27 ENCOUNTER — Encounter (HOSPITAL_COMMUNITY): Payer: Self-pay | Admitting: Emergency Medicine

## 2013-06-27 ENCOUNTER — Emergency Department (HOSPITAL_COMMUNITY): Payer: No Typology Code available for payment source

## 2013-06-27 ENCOUNTER — Emergency Department (HOSPITAL_COMMUNITY)
Admission: EM | Admit: 2013-06-27 | Discharge: 2013-06-27 | Disposition: A | Discharge status: Home / Self Care | Payer: No Typology Code available for payment source | Attending: Emergency Medicine | Admitting: Emergency Medicine

## 2013-06-27 DIAGNOSIS — S335XXA Sprain of ligaments of lumbar spine, initial encounter: Secondary | ICD-10-CM | POA: Insufficient documentation

## 2013-06-27 DIAGNOSIS — IMO0002 Reserved for concepts with insufficient information to code with codable children: Secondary | ICD-10-CM | POA: Insufficient documentation

## 2013-06-27 DIAGNOSIS — S0990XA Unspecified injury of head, initial encounter: Secondary | ICD-10-CM | POA: Insufficient documentation

## 2013-06-27 DIAGNOSIS — Y9389 Activity, other specified: Secondary | ICD-10-CM | POA: Insufficient documentation

## 2013-06-27 DIAGNOSIS — Y92410 Unspecified street and highway as the place of occurrence of the external cause: Secondary | ICD-10-CM | POA: Insufficient documentation

## 2013-06-27 DIAGNOSIS — F17200 Nicotine dependence, unspecified, uncomplicated: Secondary | ICD-10-CM | POA: Insufficient documentation

## 2013-06-27 DIAGNOSIS — Z975 Presence of (intrauterine) contraceptive device: Secondary | ICD-10-CM | POA: Insufficient documentation

## 2013-06-27 MED ORDER — IBUPROFEN 800 MG PO TABS
800.0000 mg | ORAL_TABLET | Freq: Once | ORAL | Status: AC
  Administered 2013-06-27: 800 mg via ORAL
  Filled 2013-06-27: qty 1

## 2013-06-27 MED ORDER — HYDROCODONE-ACETAMINOPHEN 5-325 MG PO TABS: 1.0000 | ORAL_TABLET | Freq: Four times a day (QID) | ORAL | Status: DC | PRN
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
## 2013-06-27 NOTE — ED Notes (Signed)
Jacubowitz, MD at bedside.  
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
## 2013-11-17 ENCOUNTER — Emergency Department (HOSPITAL_COMMUNITY)
Admission: EM | Admit: 2013-11-17 | Discharge: 2013-11-17 | Disposition: A | Discharge status: Home / Self Care | Payer: No Typology Code available for payment source | Attending: Emergency Medicine | Admitting: Emergency Medicine

## 2013-11-17 ENCOUNTER — Encounter (HOSPITAL_COMMUNITY): Payer: Self-pay | Admitting: Emergency Medicine

## 2013-11-17 DIAGNOSIS — F17200 Nicotine dependence, unspecified, uncomplicated: Secondary | ICD-10-CM | POA: Insufficient documentation

## 2013-11-17 DIAGNOSIS — Z975 Presence of (intrauterine) contraceptive device: Secondary | ICD-10-CM | POA: Insufficient documentation

## 2013-11-17 DIAGNOSIS — R229 Localized swelling, mass and lump, unspecified: Secondary | ICD-10-CM | POA: Insufficient documentation

## 2013-11-17 NOTE — Discharge Instructions (Signed)
Excision of Skin Lesions   Excision of a skin lesion refers to the removal of a section of skin by making small cuts (incisions) in the skin. This is typically done to remove a cancerous growth (basal cell carcinoma, squamous cell carcinoma, or melanoma) or a noncancerous growth (cyst). It may be done to treat or prevent cancer or infection. It may also be done to improve cosmetic appearance (removal of mole, skin tag).   LET YOUR CAREGIVER KNOW ABOUT:   Allergies to food or medicine.   Medicines taken, including vitamins, herbs, eyedrops, over-the-counter medicines, and creams.   Use of steroids (by mouth or creams).   Previous problems with anesthetics or numbing medicines.   History of bleeding problems or blood clots.   History of any prostheses.   Previous surgery.   Other health problems, including diabetes and kidney problems.   Possibility of pregnancy, if this applies.  RISKS AND COMPLICATIONS   Many complications can be managed. With appropriate treatment and rehabilitation, the following complications are very uncommon:   Bleeding.   Infection.   Scarring.   Recurrence of cyst or cancer.   Changes in skin sensation or appearance (discoloration, swelling).   Reaction to anesthesia.   Allergic reaction to surgical materials or ointments.   Damage to nerves, blood vessels, muscles, or other structures.   Continued pain.  BEFORE THE PROCEDURE   It is important to follow your caregiver's instructions prior to your procedure to avoid complications. Steps before your procedure may include:   Physical exam, blood tests, other procedures, such as removing a small sample for examination under a microscope (biopsy).   Your caregiver may review the procedure, the anesthesia being used, and what to expect after the procedure with you.  You may be asked to:   Stop taking certain medicines, such as blood thinners (including aspirin, clopidogrel, ibuprofen), for several days prior to your procedure.   Take certain  medicines.   Stop smoking.  It is a good idea to arrange for a ride home after surgery and to have someone to help you with activities during recovery.   PROCEDURE   There are several excision techniques. The type of excision or surgical technique used will depend on your condition, the location of the lesion, and your overall health. After the lesion is sterilized and a local anesthetic is applied, the following may be performed:   Complete surgical excision   The area to be removed is marked with a pen. Using a small scalpel and scissors, the surgeon gently cuts around and under the lesion until it is completely removed. The lesion is placed in a special fluid and sent to the lab for examination. If necessary, bleeding will be controlled with a device that delivers heat. The edges of the wound are stitched together and a dressing is applied. This procedure may be performed to treat a cancerous growth or noncancerous cyst or lesion. Surgeons commonly perform an elliptical excision, to minimize scarring.   Excision of a cyst   The surgeon makes an incision on the cyst. The entire cyst is removed through the incision. The wound may be closed with a suture (stitch).   Shave excision   During shave excision, the surgeon uses a small blade or loop instrument to shave off the lesion. This may be done to remove a mole or skin tag. The wound is usually left to heal on its own without stitches.   Punch excision   During punch excision,   the surgeon uses a small, round tool (like a cookie cutter) to cut a circle shape out of the skin. The outer edges of the skin are stitched together. This may be done to remove a mole or scar or to perform a biopsy of the lesion.   Mohs micrographic surgery   During Mohs micrographic surgery, layers of the lesion are removed with a scalpel or loop instrument and immediately examined under a microscope until all of the abnormal or cancerous tissue is removed. This procedure is minimally  invasive and ensures the best cosmetic outcome, with removal of as little normal tissue as possible. Mohs is usually done to treat skin cancer, such as basal cell carcinoma or squamous cell carcinoma, particularly on the face and ears.   Antibiotic ointment is applied to the surgical area after each of the procedures listed above, as necessary.   AFTER THE PROCEDURE   How well you heal depends on many factors. Most patients heal quite well with proper techniques and self-care. Scarring will lessen over time.   HOME CARE INSTRUCTIONS   Take medicines for pain as directed.   Keep the incision area clean, dry, and protected for at least 48 hours. Change dressings as directed.   For bleeding, apply gentle but firm pressure to the wound using a folded towel for 20 minutes. Call your caregiver if bleeding does not stop.   Avoid high-impact exercise and activities until the stitches are removed or the area heals.   Follow your caregiver's instructions to minimize scarring. Avoid sun exposure until the area has healed. Scarring should lessen over time.   Follow up with your caregiver as directed. Removal of stitches within 4 to 14 days may be necessary.  Finding out the results of your test   Not all test results are available during your visit. If your test results are not back during the visit, make an appointment with your caregiver to find out the results. Do not assume everything is normal if you have not heard from your caregiver or the medical facility. It is important for you to follow up on all of your test results.   SEEK MEDICAL CARE IF:   You or your child has an oral temperature above 102 F (38.9 C).   You develop signs of infection (chills, feeling unwell).   You notice bleeding, pain, discharge, redness, or swelling at the incision site.   You notice skin irregularities or changes in sensation.  MAKE SURE YOU:   Understand these instructions.   Will watch your condition.   Will get help right away if you  are not doing well or get worse.  FOR MORE INFORMATION   American Academy [employer]: www.aafp.org   American Academy of Dermatology: www.aad.org   Document Released: 10/17/2009 Document Revised: 10/15/2011 Document Reviewed: 10/17/2009   [employer] Patient Information 2014 [employer].

## 2013-11-17 NOTE — ED Notes (Signed)
Pt reports "knot" to left side of posterior head with intemittent "shooting" pain x 1 year. Reports pain is 9/10. Pain with palpation to area. Denies drainage. Neuro intact.

## 2013-11-17 NOTE — ED Provider Notes (Signed)
CSN: [id number]     Arrival date & time 11/17/13  2222 History  This chart was scribed for non-physician practitioner, Montine Circle, PA-C,working with Jasper Riling. Alvino Chapel, MD, by Marlowe Kays, ED Scribe.  This patient was seen in room TR09C/TR09C and the patient's care was started at 11:11 PM.  Chief Complaint  Patient presents with  . Cyst   The history is provided by the patient. No language interpreter was used.   HPI Comments:  Carrie Krueger is a 28 y.o. female who presents to the Emergency Department complaining of a painful nodule on the left posterior head that appeared approximately one year ago. She states the area has becoming increasingly more painful recently. She describes the pain as shooting pain. She reports that she thought she had an ingrown hair at first but the area has gotten bigger over time. She states she has been applying warm compresses with no relief. Pt denies drainage or fever. Pt denies having a PCP.   History reviewed. No pertinent past medical history. History reviewed. No pertinent past surgical history. No family history on file. History  Substance Use Topics  . Smoking status: Current Every Day Smoker -- 0.50 packs/day    Types: Cigarettes  . Smokeless tobacco: Not on file  . Alcohol Use: Yes     Comment: occ   OB History   Grav Para Term Preterm Abortions TAB SAB Ect Mult Living       0     1     Review of Systems  Constitutional: Negative for fever.  HENT:       Cyst to posterior left side of head    Allergies  Bactrim and Tomato  Home Medications   Prior to Admission medications   Medication Sig Start Date End Date Taking? Authorizing Provider  levonorgestrel (MIRENA) 20 MCG/24HR IUD 1 each by Intrauterine route once.   Yes Historical Provider, MD  Multiple Vitamin (MULTIVITAMIN WITH MINERALS) TABS tablet Take 1 tablet by mouth daily.   Yes Historical Provider, MD   Triage Vitals: BP 115/62  Pulse 98  Temp(Src) 98.7 F (37.1  C) (Oral)  SpO2 100% Physical Exam  Nursing note and vitals reviewed. Constitutional: She is oriented to person, place, and time. She appears well-developed and well-nourished.  HENT:  Head: Normocephalic and atraumatic.  Small 1 cm hyperpigmented nodule. No surrounding erythema or discharge. Moderately tender to palpation.   Eyes: EOM are normal.  Neck: Normal range of motion.  Cardiovascular: Normal rate.   Pulmonary/Chest: Effort normal.  Musculoskeletal: Normal range of motion.  Neurological: She is alert and oriented to person, place, and time.  Skin: Skin is warm and dry.  Psychiatric: She has a normal mood and affect. Her behavior is normal.    ED Course  Procedures (including critical care time) DIAGNOSTIC STUDIES: Oxygen Saturation is 100% on RA, normal by my interpretation.   COORDINATION OF CARE: 11:15 PM- Will refer to dermatology. Pt verbalizes understanding and agrees to plan.  Medications - No data to display  Labs Review Labs Reviewed - No data to display  Imaging Review No results found.   EKG Interpretation None      MDM   Final diagnoses:  Skin nodule    Patient with skin nodule.  Unclear etiology.  No fever.  No in any apparent distress.  No abscess or cellulitis.  Will discharge with dermatology follow-up.  Patient understands and agrees with the plan.  I personally performed the services described  in this documentation, which was scribed in my presence. The recorded information has been reviewed and is accurate.    Montine Circle, PA-C 11/18/13 [phone]

## 2013-11-20 NOTE — ED Provider Notes (Signed)
Medical screening examination/treatment/procedure(s) were performed by non-physician practitioner and as supervising physician I was immediately available for consultation/collaboration.   EKG Interpretation None       Kindsey Eblin R. Shalawn Wynder, MD 11/20/13 1101 

## 2014-01-02 ENCOUNTER — Emergency Department (HOSPITAL_COMMUNITY)
Admission: EM | Admit: 2014-01-02 | Discharge: 2014-01-02 | Disposition: A | Discharge status: Home / Self Care | Payer: No Typology Code available for payment source | Attending: Emergency Medicine | Admitting: Emergency Medicine

## 2014-01-02 ENCOUNTER — Encounter (HOSPITAL_COMMUNITY): Payer: Self-pay | Admitting: Emergency Medicine

## 2014-01-02 ENCOUNTER — Emergency Department (HOSPITAL_COMMUNITY): Payer: No Typology Code available for payment source

## 2014-01-02 DIAGNOSIS — J029 Acute pharyngitis, unspecified: Secondary | ICD-10-CM | POA: Insufficient documentation

## 2014-01-02 DIAGNOSIS — Z975 Presence of (intrauterine) contraceptive device: Secondary | ICD-10-CM | POA: Insufficient documentation

## 2014-01-02 DIAGNOSIS — F17200 Nicotine dependence, unspecified, uncomplicated: Secondary | ICD-10-CM | POA: Insufficient documentation

## 2014-01-02 DIAGNOSIS — S0993XA Unspecified injury of face, initial encounter: Secondary | ICD-10-CM | POA: Insufficient documentation

## 2014-01-02 DIAGNOSIS — S199XXA Unspecified injury of neck, initial encounter: Secondary | ICD-10-CM

## 2014-01-02 DIAGNOSIS — S298XXA Other specified injuries of thorax, initial encounter: Secondary | ICD-10-CM | POA: Insufficient documentation

## 2014-01-02 DIAGNOSIS — R296 Repeated falls: Secondary | ICD-10-CM | POA: Insufficient documentation

## 2014-01-02 DIAGNOSIS — Z3202 Encounter for pregnancy test, result negative: Secondary | ICD-10-CM | POA: Insufficient documentation

## 2014-01-02 DIAGNOSIS — R55 Syncope and collapse: Secondary | ICD-10-CM | POA: Insufficient documentation

## 2014-01-02 DIAGNOSIS — Y93E1 Activity, personal bathing and showering: Secondary | ICD-10-CM | POA: Insufficient documentation

## 2014-01-02 DIAGNOSIS — Y9289 Other specified places as the place of occurrence of the external cause: Secondary | ICD-10-CM | POA: Insufficient documentation

## 2014-01-02 LAB — I-STAT CHEM 8, ED
BUN: 6 mg/dL (ref 6–23)
CHLORIDE: 103 meq/L (ref 96–112)
CREATININE: 0.8 mg/dL (ref 0.50–1.10)
Calcium, Ion: 1.21 mmol/L (ref 1.12–1.23)
GLUCOSE: 87 mg/dL (ref 70–99)
HCT: 44 % (ref 36.0–46.0)
Hemoglobin: 15 g/dL (ref 12.0–15.0)
POTASSIUM: 3.6 meq/L — AB (ref 3.7–5.3)
Sodium: 146 mEq/L (ref 137–147)
TCO2: 23 mmol/L (ref 0–100)

## 2014-01-02 LAB — URINALYSIS, ROUTINE W REFLEX MICROSCOPIC
BILIRUBIN URINE: NEGATIVE
GLUCOSE, UA: NEGATIVE mg/dL
HGB URINE DIPSTICK: NEGATIVE
KETONES UR: NEGATIVE mg/dL
Leukocytes, UA: NEGATIVE
Nitrite: NEGATIVE
PH: 7 (ref 5.0–8.0)
Protein, ur: NEGATIVE mg/dL
Specific Gravity, Urine: 1.02 (ref 1.005–1.030)
Urobilinogen, UA: 1 mg/dL (ref 0.0–1.0)

## 2014-01-02 LAB — RAPID URINE DRUG SCREEN, HOSP PERFORMED
AMPHETAMINES: NOT DETECTED
BARBITURATES: NOT DETECTED
BENZODIAZEPINES: NOT DETECTED
Cocaine: NOT DETECTED
Opiates: NOT DETECTED
Tetrahydrocannabinol: POSITIVE — AB

## 2014-01-02 LAB — PREGNANCY, URINE: Preg Test, Ur: NEGATIVE

## 2014-01-02 NOTE — ED Notes (Addendum)
Listening to voicemail messages at this time; states she feels much better than when she came in

## 2014-01-02 NOTE — ED Notes (Signed)
Pt stated, I was in the shower and I passed out in the shower don't remember how I fell, I just remember them getting me out of the shower.  Happened about hour ago and I drove here.  I still feel light headed.

## 2014-01-02 NOTE — ED Provider Notes (Signed)
CSN: [id number]     Arrival date & time 01/02/14  1221 History   None    Chief Complaint  Patient presents with  . Dizziness     (Consider location/radiation/quality/duration/timing/severity/associated sxs/prior Treatment) HPI  Carrie Krueger is a 28 y.o. female was getting ready for work, and a shower, when she passed out. She reports that friends helped her out of the shower. She injured her forehead in the fall. She feels sleepy now, but denies weakness, paresthesias, nausea or vomiting. She has recently been evaluated for a sore on the left occipital scalp, and has been referred to surgery, for question of excision. That appointment is scheduled for 01/04/2014. She denies recent fever, chills, nausea, vomiting, cough, shortness of breath, or chest pain. She has a mild sore throat that began yesterday. There are no other known modifying factors.  History reviewed. No pertinent past medical history. History reviewed. No pertinent past surgical history. No family history on file. History  Substance Use Topics  . Smoking status: Current Every Day Smoker -- 0.50 packs/day    Types: Cigarettes  . Smokeless tobacco: Not on file  . Alcohol Use: Yes     Comment: occ   OB History   Grav Para Term Preterm Abortions TAB SAB Ect Mult Living       0     1     Review of Systems  All other systems reviewed and are negative.     Allergies  Bactrim and Tomato  Home Medications   Prior to Admission medications   Medication Sig Start Date End Date Taking? Authorizing Provider  levonorgestrel (MIRENA) 20 MCG/24HR IUD 1 each by Intrauterine route once.   Yes Historical Provider, MD  Multiple Vitamin (MULTIVITAMIN WITH MINERALS) TABS tablet Take 1 tablet by mouth daily.   Yes Historical Provider, MD   BP 97/58  Pulse 80  Temp(Src) 98.2 F (36.8 C)  Resp 16  SpO2 95% Physical Exam  Nursing note and vitals reviewed. Constitutional: She is oriented to person, place, and time. She  appears well-developed and well-nourished. No distress.  HENT:  Head: Normocephalic.  Mild tenderness over the midforehead without associated swelling or crepitation.  Eyes: Conjunctivae and EOM are normal. Pupils are equal, round, and reactive to light.  Neck: Normal range of motion and phonation normal. Neck supple.  Cardiovascular: Normal rate, regular rhythm and intact distal pulses.   Pulmonary/Chest: Effort normal and breath sounds normal. She exhibits tenderness (Left lower anterior chest wall, without crepitation or deformity).  Abdominal: Soft. She exhibits no distension. There is no tenderness. There is no guarding.  Musculoskeletal: Normal range of motion. She exhibits no edema and no tenderness.  Neurological: She is alert and oriented to person, place, and time. She exhibits normal muscle tone.  Skin: Skin is warm and dry.  Psychiatric: She has a normal mood and affect. Her behavior is normal. Judgment and thought content normal.    ED Course  Procedures (including critical care time)   Medications - No data to display  Patient Vitals for the past 24 hrs:  BP Temp Pulse Resp SpO2  01/02/14 1545 97/58 mmHg - 80 - 95 %  01/02/14 1515 99/67 mmHg - 80 - 97 %  01/02/14 1459 99/59 mmHg - 69 16 96 %  01/02/14 1254 107/55 mmHg - 109 21 98 %  01/02/14 1227 126/87 mmHg 98.2 F (36.8 C) 118 18 100 %    4:28 PM Reevaluation with update and discussion. After initial  assessment and treatment, an updated evaluation reveals she feels better now, she is able to ambulate easily, she is tolerating oral nutrition and hydration. I discussed the findings with the patient, all questions were answered. Richarda Blade    Labs Review Labs Reviewed  URINE RAPID DRUG SCREEN (HOSP PERFORMED) - Abnormal; Notable for the following:    Tetrahydrocannabinol POSITIVE (*)    All other components within normal limits  I-STAT CHEM 8, ED - Abnormal; Notable for the following:    Potassium 3.6 (*)     All other components within normal limits  URINALYSIS, ROUTINE W REFLEX MICROSCOPIC  PREGNANCY, URINE    Imaging Review Dg Ribs Unilateral W/chest Left  01/02/2014   CLINICAL DATA:  Left lower anterior rib pain after fall in shower today  EXAM: LEFT RIBS AND CHEST - 3+ VIEW  COMPARISON:  12/15/2010; chest CT -12/16/2010  FINDINGS: Grossly unchanged cardiac silhouette and mediastinal contours. No focal parenchymal renal inguinal thorax. Noted edema.  No displaced left-sided rib fractures. Regional soft tissues appear normal. No radiopaque foreign body.  IMPRESSION: No acute cardiopulmonary disease. Specifically, no displaced left-sided rib fractures.   Electronically Signed   By: Sandi Mariscal M.D.   On: 01/02/2014 14:40   Ct Head Wo Contrast  01/02/2014   CLINICAL DATA:  Dizziness and lethargy  EXAM: CT HEAD WITHOUT CONTRAST  TECHNIQUE: Contiguous axial images were obtained from the base of the skull through the vertex without intravenous contrast.  COMPARISON:  12/16/2010  FINDINGS: The bony calvarium is intact. The ventricles are of normal size and configuration. No findings to suggest acute hemorrhage, acute infarction or space-occupying mass lesion are noted.  IMPRESSION: No acute abnormality noted.   Electronically Signed   By: Inez Catalina M.D.   On: 01/02/2014 14:11     EKG Interpretation None      MDM   Final diagnoses:  Syncope    Syncope, without clear cause. No significant injury. And evaluation for causative factors were negative. \ Nursing Notes Reviewed/ Care Coordinated Applicable Imaging Reviewed Interpretation of Laboratory Data incorporated into ED treatment  The patient appears reasonably screened and/or stabilized for discharge and I doubt any other medical condition or other [facility] requiring further screening, evaluation, or treatment in the ED at this time prior to discharge.  Plan: Home Medications- none; Home Treatments- rest; return here if the recommended  treatment, does not improve the symptoms; Recommended follow up- PCP prn    Richarda Blade, MD 01/02/14 [phone]

## 2014-01-02 NOTE — Discharge Instructions (Signed)
Try to eat and drink regularly. See the doctor because, as needed, for problems.   Syncope Syncope is a fainting spell. This means the person loses consciousness and drops to the ground. The person is generally unconscious for less than 5 minutes. The person may have some muscle twitches for up to 15 seconds before waking up and returning to normal. Syncope occurs more often in elderly people, but it can happen to anyone. While most causes of syncope are not dangerous, syncope can be a sign of a serious medical problem. It is important to seek medical care.  CAUSES  Syncope is caused by a sudden decrease in blood flow to the brain. The specific cause is often not determined. Factors that can trigger syncope include:  Taking medicines that lower blood pressure.  Sudden changes in posture, such as standing up suddenly.  Taking more medicine than prescribed.  Standing in one place for too long.  Seizure disorders.  Dehydration and excessive exposure to heat.  Low blood sugar (hypoglycemia).  Straining to have a bowel movement.  Heart disease, irregular heartbeat, or other circulatory problems.  Fear, emotional distress, seeing blood, or severe pain. SYMPTOMS  Right before fainting, you may:  Feel dizzy or lightheaded.  Feel nauseous.  See all white or all black in your field of vision.  Have cold, clammy skin. DIAGNOSIS  Your caregiver will ask about your symptoms, perform a physical exam, and perform electrocardiography (ECG) to record the electrical activity of your heart. Your caregiver may also perform other heart or blood tests to determine the cause of your syncope. TREATMENT  In most cases, no treatment is needed. Depending on the cause of your syncope, your caregiver may recommend changing or stopping some of your medicines. HOME CARE INSTRUCTIONS  Have someone stay with you until you feel stable.  Do not drive, operate machinery, or play sports until your caregiver  says it is okay.  Keep all follow-up appointments as directed by your caregiver.  Lie down right away if you start feeling like you might faint. Breathe deeply and steadily. Wait until all the symptoms have passed.  Drink enough fluids to keep your urine clear or pale yellow.  If you are taking blood pressure or heart medicine, get up slowly, taking several minutes to sit and then stand. This can reduce dizziness. SEEK IMMEDIATE MEDICAL CARE IF:   You have a severe headache.  You have unusual pain in the chest, abdomen, or back.  You are bleeding from the mouth or rectum, or you have black or tarry stool.  You have an irregular or very fast heartbeat.  You have pain with breathing.  You have repeated fainting or seizure-like jerking during an episode.  You faint when sitting or lying down.  You have confusion.  You have difficulty walking.  You have severe weakness.  You have vision problems. If you fainted, call your local emergency services (911 in [place] Do not drive yourself to the hospital.  MAKE SURE YOU:  Understand these instructions.  Will watch your condition.  Will get help right away if you are not doing well or get worse. Document Released: 07/23/2005 Document Revised: 01/22/2012 Document Reviewed: 09/21/2011 [facility] Patient Information 2014 [place].

## 2014-01-06 ENCOUNTER — Encounter (INDEPENDENT_AMBULATORY_CARE_PROVIDER_SITE_OTHER): Payer: Self-pay | Admitting: Surgery

## 2014-01-06 ENCOUNTER — Ambulatory Visit (INDEPENDENT_AMBULATORY_CARE_PROVIDER_SITE_OTHER): Payer: No Typology Code available for payment source | Admitting: Surgery

## 2014-01-06 VITALS — BP 122/80 | HR 77 | Temp 97.8°F | Ht 65.0 in | Wt 168.0 lb

## 2014-01-06 DIAGNOSIS — R229 Localized swelling, mass and lump, unspecified: Secondary | ICD-10-CM

## 2014-01-06 NOTE — Progress Notes (Signed)
Chief Complaint:  Lump on back of head and left leg  History of Present Illness:  Carrie Krueger is an 28 y.o. female was seen by Nelda Severe at [place]'s office about two round nodules.  They are somewhat firm but rubbery, nontender, and not fixed to underlying area.  I discussed excision under MAC  History reviewed. No pertinent past medical history.  History reviewed. No pertinent past surgical history.  Current Outpatient Prescriptions  Medication Sig Dispense Refill  . levonorgestrel (MIRENA) 20 MCG/24HR IUD 1 each by Intrauterine route once.      . Multiple Vitamin (MULTIVITAMIN WITH MINERALS) TABS tablet Take 1 tablet by mouth daily.       No current facility-administered medications for this visit.   Bactrim and Tomato History reviewed. No pertinent family history. Social History:   reports that she has been smoking Cigarettes.  She has been smoking about 0.50 packs per day. She does not have any smokeless tobacco history on file. She reports that she drinks alcohol. She reports that she does not use illicit drugs.   REVIEW OF SYSTEMS - PERTINENT POSITIVES ONLY: noncontributory  Physical Exam:   Blood pressure 122/80, pulse 77, temperature 97.8 F (36.6 C), height 5\' 5"  (1.651 m), weight 168 lb (76.204 kg). Body mass index is 27.96 kg/(m^2).  Gen:  WDWN AAF NAD  Neurological: Alert and oriented to person, place, and time. Motor and sensory function is grossly intact  Head: Normocephalic and atraumatic. --left scalp posteriorally about 3 cm round mass Eyes: Conjunctivae are normal. Pupils are equal, round, and reactive to light. No scleral icterus.  Neck: Normal range of motion. Neck supple. No tracheal deviation or thyromegaly present.  Cardiovascular:  SR without murmurs or gallops.  No carotid bruits Respiratory: Effort normal.  No respiratory distress. No chest wall tenderness. Breath sounds normal.  No wheezes, rales or rhonchi.  Abdomen:  Non  contributory GU: Musculoskeletal: Normal range of motion. Extremities are nontender. No cyanosis, edema or clubbing noted--left leg medially  There is a round 2-3 cm mass Lymphadenopathy: No cervical, preauricular, postauricular or axillary adenopathy is present Skin: Skin is warm and dry. No rash noted. No diaphoresis. No erythema. No pallor. Pscyh: Normal mood and affect. Behavior is normal. Judgment and thought content normal.   LABORATORY RESULTS: No results found for this or any previous visit (from the past 48 hour(s)).  RADIOLOGY RESULTS: No results found.  Problem List: There are no active problems to display for this patient.   Assessment & Plan: Two nodules on the scalp and left leg that look and feel similar.  She has had some headaches which she ascribes to these.  She recently passed out in the shower and was evaluated at [facility] and no source was found.      Matt B. Hassell Done, MD, [facility] [phone] beeper [phone]  01/06/2014 4:56 PM

## 2014-01-06 NOTE — Patient Instructions (Signed)

## 2014-01-19 ENCOUNTER — Inpatient Hospital Stay (HOSPITAL_COMMUNITY): Admission: RE | Admit: 2014-01-19 | Payer: No Typology Code available for payment source | Source: Ambulatory Visit

## 2014-01-27 ENCOUNTER — Ambulatory Visit (HOSPITAL_COMMUNITY)
Admission: RE | Admit: 2014-01-27 | Payer: No Typology Code available for payment source | Source: Ambulatory Visit | Admitting: Surgery

## 2014-01-27 ENCOUNTER — Encounter (HOSPITAL_COMMUNITY): Admission: RE | Payer: Self-pay | Source: Ambulatory Visit

## 2014-01-27 SURGERY — EXCISION MASS
Anesthesia: General

## 2014-10-25 ENCOUNTER — Inpatient Hospital Stay (HOSPITAL_COMMUNITY)
Admission: AD | Admit: 2014-10-25 | Discharge: 2014-10-25 | Disposition: A | Discharge status: Home / Self Care | Payer: Self-pay | Source: Ambulatory Visit | Attending: Obstetrics & Gynecology | Admitting: Obstetrics & Gynecology

## 2014-10-25 ENCOUNTER — Encounter (HOSPITAL_COMMUNITY): Payer: Self-pay | Admitting: *Deleted

## 2014-10-25 DIAGNOSIS — O209 Hemorrhage in early pregnancy, unspecified: Secondary | ICD-10-CM | POA: Insufficient documentation

## 2014-10-25 DIAGNOSIS — N390 Urinary tract infection, site not specified: Secondary | ICD-10-CM

## 2014-10-25 DIAGNOSIS — O2340 Unspecified infection of urinary tract in pregnancy, unspecified trimester: Secondary | ICD-10-CM | POA: Insufficient documentation

## 2014-10-25 DIAGNOSIS — Z3A00 Weeks of gestation of pregnancy not specified: Secondary | ICD-10-CM | POA: Insufficient documentation

## 2014-10-25 DIAGNOSIS — O4691 Antepartum hemorrhage, unspecified, first trimester: Secondary | ICD-10-CM

## 2014-10-25 DIAGNOSIS — Z87891 Personal history of nicotine dependence: Secondary | ICD-10-CM | POA: Insufficient documentation

## 2014-10-25 HISTORY — DX: Other specified health status: Z78.9

## 2014-10-25 LAB — URINALYSIS, ROUTINE W REFLEX MICROSCOPIC
Bilirubin Urine: NEGATIVE
GLUCOSE, UA: NEGATIVE mg/dL
KETONES UR: NEGATIVE mg/dL
Leukocytes, UA: NEGATIVE
Nitrite: POSITIVE — AB
PH: 6 (ref 5.0–8.0)
Protein, ur: NEGATIVE mg/dL
Specific Gravity, Urine: 1.02 (ref 1.005–1.030)
Urobilinogen, UA: 0.2 mg/dL (ref 0.0–1.0)

## 2014-10-25 LAB — CBC
HEMATOCRIT: 37 % (ref 36.0–46.0)
HEMOGLOBIN: 12.7 g/dL (ref 12.0–15.0)
MCH: 28.4 pg (ref 26.0–34.0)
MCHC: 34.3 g/dL (ref 30.0–36.0)
MCV: 82.8 fL (ref 78.0–100.0)
Platelets: 243 10*3/uL (ref 150–400)
RBC: 4.47 MIL/uL (ref 3.87–5.11)
RDW: 12.7 % (ref 11.5–15.5)
WBC: 4.6 10*3/uL (ref 4.0–10.5)

## 2014-10-25 LAB — URINE MICROSCOPIC-ADD ON

## 2014-10-25 LAB — POCT PREGNANCY, URINE: PREG TEST UR: NEGATIVE

## 2014-10-25 LAB — HCG, QUANTITATIVE, PREGNANCY: hCG, Beta Chain, Quant, S: 7 m[IU]/mL — ABNORMAL HIGH (ref <5)

## 2014-10-25 MED ORDER — CIPROFLOXACIN HCL 500 MG PO TABS: 500.0000 mg | ORAL_TABLET | Freq: Two times a day (BID) | ORAL | Status: DC

## 2014-10-25 NOTE — MAU Note (Signed)
Pos HPT last Wednesday, started bleeding on Saturday - initially was brown now dark red, passing clots.  Began having abd pain on Saturday, is not as intense now.

## 2014-10-25 NOTE — MAU Provider Note (Signed)
History     CSN: [id number]  Arrival date and time: 10/25/14 1158   First Provider Initiated Contact with Patient 10/25/14 1614      Chief Complaint  Patient presents with  . Abdominal Pain  . Vaginal Bleeding   HPI Comments: Carrie Krueger is a 29 y.o. G1P0 at Unknown who presents today with vaginal bleeding. SHe states that she started bleeding on 10/23/14, and it became worse over the course of that evening. She has continued to have bleeding slightly heavier than a period since then. She has been passing clots about the size of a nickel. She had a + HPT on 10/20/14. She has had some cramping as well. She rates her pain currently 6/10. She has not taken anything for the pain at this time.   Vaginal Bleeding The patient's primary symptoms include pelvic pain and vaginal bleeding. This is a new problem. The current episode started in the past 7 days. The problem has been unchanged. The pain is moderate. She is pregnant. Associated symptoms include abdominal pain. Pertinent negatives include no dysuria, fever, frequency, nausea, urgency or vomiting. The vaginal bleeding is heavier than menses. She has been passing clots. She has not been passing tissue. She is sexually active. She uses nothing for contraception. Her menstrual history has been regular.    Past Medical History  Diagnosis Date  . Medical history non-contributory     Past Surgical History  Procedure Laterality Date  . No past surgeries      History reviewed. No pertinent family history.  History  Substance Use Topics  . Smoking status: Former Smoker -- 0.50 packs/day    Types: Cigarettes  . Smokeless tobacco: Not on file  . Alcohol Use: Yes     Comment: occ    Allergies:  Allergies  Allergen Reactions  . Bactrim [Sulfamethoxazole-Trimethoprim] Hives  . Tomato Hives and Swelling    No prescriptions prior to admission    Review of Systems  Constitutional: Negative for fever.  Gastrointestinal: Positive for  abdominal pain. Negative for nausea and vomiting.  Genitourinary: Positive for vaginal bleeding and pelvic pain. Negative for dysuria, urgency and frequency.  All other systems reviewed and are negative.  Physical Exam   Blood pressure 114/73, pulse 77, temperature 98.9 F (37.2 C), temperature source Oral, resp. rate 18, height 5\' 5"  (1.651 m), weight 78.109 kg (172 lb 3.2 oz), last menstrual period 09/19/2014.  Physical Exam  Nursing note and vitals reviewed. Constitutional: She is oriented to person, place, and time. She appears well-developed and well-nourished. No distress.  Cardiovascular: Normal rate.   Respiratory: Effort normal.  GI: Soft. There is no tenderness.  Neurological: She is alert and oriented to person, place, and time.  Skin: Skin is warm and dry.  Psychiatric: She has a normal mood and affect.   Results for orders placed or performed during the hospital encounter of 10/25/14 (from the past 24 hour(s))  Urinalysis, Routine w reflex microscopic     Status: Abnormal   Collection Time: 10/25/14 12:48 PM  Result Value Ref Range   Color, Urine YELLOW YELLOW   APPearance CLEAR CLEAR   Specific Gravity, Urine 1.020 1.005 - 1.030   pH 6.0 5.0 - 8.0   Glucose, UA NEGATIVE NEGATIVE mg/dL   Hgb urine dipstick LARGE (A) NEGATIVE   Bilirubin Urine NEGATIVE NEGATIVE   Ketones, ur NEGATIVE NEGATIVE mg/dL   Protein, ur NEGATIVE NEGATIVE mg/dL   Urobilinogen, UA 0.2 0.0 - 1.0 mg/dL   Nitrite  POSITIVE (A) NEGATIVE   Leukocytes, UA NEGATIVE NEGATIVE  Urine microscopic-add on     Status: Abnormal   Collection Time: 10/25/14 12:48 PM  Result Value Ref Range   RBC / HPF 0-2 <3 RBC/hpf   Bacteria, UA MANY (A) RARE  Pregnancy, urine POC     Status: None   Collection Time: 10/25/14  1:20 PM  Result Value Ref Range   Preg Test, Ur NEGATIVE NEGATIVE  hCG, quantitative, pregnancy     Status: Abnormal   Collection Time: 10/25/14  1:50 PM  Result Value Ref Range   hCG, Beta  Chain, Quant, S 7 (H) <5 mIU/mL  CBC     Status: None   Collection Time: 10/25/14  1:50 PM  Result Value Ref Range   WBC 4.6 4.0 - 10.5 K/uL   RBC 4.47 3.87 - 5.11 MIL/uL   Hemoglobin 12.7 12.0 - 15.0 g/dL   HCT 37.0 36.0 - 46.0 %   MCV 82.8 78.0 - 100.0 fL   MCH 28.4 26.0 - 34.0 pg   MCHC 34.3 30.0 - 36.0 g/dL   RDW 12.7 11.5 - 15.5 %   Platelets 243 150 - 400 K/uL    MAU Course  Procedures  MDM UA CBC HCG  Assessment and Plan   1. Vaginal bleeding in pregnancy, first trimester   2. UTI (lower urinary tract infection)    DC home D/W the patient at length that this likely represents an early miscarriage  Bleeding precautions reviewed RX cipro  Return to MAU in 48 hours  Follow-up Information    Follow up with [facility].   Why:  Return on Wednesday 10/27/14 for repeat blood work    Contact information:   [address] [id number] [place] [phone]       Mathis Bud 10/25/2014, 4:32 PM

## 2014-10-25 NOTE — Discharge Instructions (Signed)
Vaginal Bleeding During Pregnancy, First Trimester A small amount of bleeding (spotting) from the vagina is common in early pregnancy. Sometimes the bleeding is normal and is not a problem, and sometimes it is a sign of something serious. Be sure to tell your doctor about any bleeding from your vagina right away. HOME CARE  Watch your condition for any changes.  Follow your doctor's instructions about how active you can be.  If you are on bed rest:  You may need to stay in bed and only get up to use the bathroom.  You may be allowed to do some activities.  If you need help, make plans for someone to help you.  Write down:  The number of pads you use each day.  How often you change pads.  How soaked (saturated) your pads are.  Do not use tampons.  Do not douche.  Do not have sex or orgasms until your doctor says it is okay.  If you pass any tissue from your vagina, save the tissue so you can show it to your doctor.  Only take medicines as told by your doctor.  Do not take aspirin because it can make you bleed.  Keep all follow-up visits as told by your doctor. GET HELP IF:   You bleed from your vagina.  You have cramps.  You have labor pains.  You have a fever that does not go away after you take medicine. GET HELP RIGHT AWAY IF:   You have very bad cramps in your back or belly (abdomen).  You pass large clots or tissue from your vagina.  You bleed more.  You feel light-headed or weak.  You pass out (faint).  You have chills.  You are leaking fluid or have a gush of fluid from your vagina.  You pass out while pooping (having a bowel movement). MAKE SURE YOU:  Understand these instructions.  Will watch your condition.  Will get help right away if you are not doing well or get worse. Document Released: 12/07/2013 Document Reviewed: 03/30/2013 [facility] Patient Information 2015 [place]. This information is not intended to replace advice given  to you by your health care provider. Make sure you discuss any questions you have with your health care provider.

## 2014-10-27 ENCOUNTER — Inpatient Hospital Stay (HOSPITAL_COMMUNITY)
Admission: AD | Admit: 2014-10-27 | Discharge: 2014-10-27 | Disposition: A | Discharge status: Home / Self Care | Payer: Self-pay | Source: Ambulatory Visit | Attending: Obstetrics & Gynecology | Admitting: Obstetrics & Gynecology

## 2014-10-27 DIAGNOSIS — O039 Complete or unspecified spontaneous abortion without complication: Secondary | ICD-10-CM | POA: Insufficient documentation

## 2014-10-27 DIAGNOSIS — Z3A00 Weeks of gestation of pregnancy not specified: Secondary | ICD-10-CM | POA: Insufficient documentation

## 2014-10-27 LAB — HCG, QUANTITATIVE, PREGNANCY: hCG, Beta Chain, Quant, S: 3 m[IU]/mL (ref <5)

## 2014-10-27 MED ORDER — IBUPROFEN 600 MG PO TABS: 600.0000 mg | ORAL_TABLET | Freq: Four times a day (QID) | ORAL | Status: DC | PRN

## 2014-10-27 NOTE — Discharge Instructions (Signed)
Miscarriage A miscarriage is the loss of an unborn baby (fetus) before the 20th week of pregnancy. The cause is often unknown.  HOME CARE  You may need to stay in bed (bed rest), or you may be able to do light activity. Go about activity as told by your doctor.  Have help at home.  Write down how many pads you use each day. Write down how soaked they are.  Do not use tampons. Do not wash out your vagina (douche) or have sex (intercourse) until your doctor approves.  Only take medicine as told by your doctor.  Do not take aspirin.  Keep all doctor visits as told.  If you or your partner have problems with grieving, talk to your doctor. You can also try counseling. Give yourself time to grieve before trying to get pregnant again. GET HELP RIGHT AWAY IF:  You have bad cramps or pain in your back or belly (abdomen).  You have a fever.  You pass large clumps of blood (clots) from your vagina that are walnut-sized or larger. Save the clumps for your doctor to see.  You pass large amounts of tissue from your vagina. Save the tissue for your doctor to see.  You have more bleeding.  You have thick, bad-smelling fluid (discharge) coming from the vagina.  You get lightheaded, weak, or you pass out (faint).  You have chills. MAKE SURE YOU:  Understand these instructions.  Will watch your condition.  Will get help right away if you are not doing well or get worse. Document Released: 10/15/2011 Document Reviewed: 10/15/2011 [facility] Patient Information 2015 [place]. This information is not intended to replace advice given to you by your health care provider. Make sure you discuss any questions you have with your health care provider.

## 2014-10-27 NOTE — MAU Provider Note (Signed)
Subjective:  [name] is a 29 y.o. female G1P0 at Unknown gestation who presents for a follow up beta hcg. She was orginally seen on 3/21 for abdominal pain and vaginal bleeding. She had a +HPT on 3/16. Currently she denies bleeding, she continues to have bilateral, lower abdominal pain that comes and goes. The abdominal cramping started on 3/21 and has not progressed. She currently rates her pain 2/10.  She was told this was likely a miscarriage.    Objective:  GENERAL: Well-developed, well-nourished female in no acute distress.  LUNGS: Effort normal SKIN: Warm, dry and without erythema PSYCH: Normal mood and affect  There were no vitals filed for this visit.   Results for orders placed or performed during the hospital encounter of 10/27/14 (from the past 48 hour(s))  hCG, quantitative, pregnancy     Status: None   Collection Time: 10/27/14  1:25 PM  Result Value Ref Range   hCG, Beta Chain, Quant, S 3 <5 mIU/mL    Comment:          GEST. AGE      CONC.  (mIU/mL)   <=1 WEEK        5 - 50     2 WEEKS       50 - 500     3 WEEKS       100 - 10,000     4 WEEKS     1,000 - 30,000     5 WEEKS     3,500 - 115,000   6-8 WEEKS     12,000 - 270,000    12 WEEKS     15,000 - 220,000        FEMALE AND NON-PREGNANT FEMALE:     LESS THAN 5 mIU/mL    MDM:  Beta hcg level: 3/21: 7 Beta hcg level: 3/23: 3     Assessment:  1. Spontaneous miscarriage    Plan:  Discharge home in stable condition Follow up as needed, if bleeding worsens Support given [employer] given.    Lezlie Lye, NP 10/27/2014 10/27/2014 3:02 PM

## 2015-06-01 ENCOUNTER — Encounter (HOSPITAL_COMMUNITY): Payer: Self-pay | Admitting: *Deleted

## 2015-06-01 ENCOUNTER — Inpatient Hospital Stay (HOSPITAL_COMMUNITY)
Admission: AD | Admit: 2015-06-01 | Discharge: 2015-06-02 | Disposition: A | Discharge status: Home / Self Care | Payer: Medicaid Other | Source: Ambulatory Visit | Attending: Obstetrics & Gynecology | Admitting: Obstetrics & Gynecology

## 2015-06-01 ENCOUNTER — Inpatient Hospital Stay (HOSPITAL_COMMUNITY): Payer: Medicaid Other

## 2015-06-01 DIAGNOSIS — R102 Pelvic and perineal pain: Secondary | ICD-10-CM | POA: Diagnosis present

## 2015-06-01 DIAGNOSIS — Z87891 Personal history of nicotine dependence: Secondary | ICD-10-CM | POA: Insufficient documentation

## 2015-06-01 DIAGNOSIS — Z882 Allergy status to sulfonamides status: Secondary | ICD-10-CM | POA: Diagnosis not present

## 2015-06-01 DIAGNOSIS — O26891 Other specified pregnancy related conditions, first trimester: Secondary | ICD-10-CM | POA: Diagnosis not present

## 2015-06-01 DIAGNOSIS — Z3A09 9 weeks gestation of pregnancy: Secondary | ICD-10-CM | POA: Insufficient documentation

## 2015-06-01 DIAGNOSIS — R1084 Generalized abdominal pain: Secondary | ICD-10-CM

## 2015-06-01 DIAGNOSIS — O26899 Other specified pregnancy related conditions, unspecified trimester: Secondary | ICD-10-CM

## 2015-06-01 DIAGNOSIS — R109 Unspecified abdominal pain: Secondary | ICD-10-CM | POA: Insufficient documentation

## 2015-06-01 LAB — CBC
HCT: 33.9 % — ABNORMAL LOW (ref 36.0–46.0)
Hemoglobin: 11.5 g/dL — ABNORMAL LOW (ref 12.0–15.0)
MCH: 27.3 pg (ref 26.0–34.0)
MCHC: 33.9 g/dL (ref 30.0–36.0)
MCV: 80.3 fL (ref 78.0–100.0)
PLATELETS: 250 10*3/uL (ref 150–400)
RBC: 4.22 MIL/uL (ref 3.87–5.11)
RDW: 13.1 % (ref 11.5–15.5)
WBC: 7.5 10*3/uL (ref 4.0–10.5)

## 2015-06-01 LAB — URINALYSIS, ROUTINE W REFLEX MICROSCOPIC
BILIRUBIN URINE: NEGATIVE
GLUCOSE, UA: NEGATIVE mg/dL
Hgb urine dipstick: NEGATIVE
KETONES UR: NEGATIVE mg/dL
Leukocytes, UA: NEGATIVE
Nitrite: NEGATIVE
Protein, ur: NEGATIVE mg/dL
Specific Gravity, Urine: 1.015 (ref 1.005–1.030)
Urobilinogen, UA: 0.2 mg/dL (ref 0.0–1.0)
pH: 8 (ref 5.0–8.0)

## 2015-06-01 LAB — WET PREP, GENITAL
Trich, Wet Prep: NONE SEEN
Yeast Wet Prep HPF POC: NONE SEEN

## 2015-06-01 LAB — POCT PREGNANCY, URINE: Preg Test, Ur: POSITIVE — AB

## 2015-06-01 NOTE — Discharge Instructions (Signed)
°  Prenatal Care Providers °[facility]    [facility] ° Phone- [phone]     Phone: [phone] °         °[facility]                      [facility] ° @[place]     Phone: [phone] ° Phone: [phone] °        [facility] °[facility]     Phone: [phone] ° Phone: [phone]   °        [facility] °[facility] [place]                hone: [phone] ° Phone: [phone] °        [facility] °Dr. Bernard Marshall      Phone: [phone] ° Phone: [phone] °        [facility] °[facility]                Phone: [phone] ° Women’s Health  ° Phone:[phone]    [facility] °         Phone: [phone] °[facility] °  Phone: [phone] °Safe Medications in Pregnancy  ° °Acne: °Benzoyl Peroxide °Salicylic Acid ° °Backache/Headache: °Tylenol: 2 regular strength every 4 hours OR °             2 Extra strength every 6 hours ° °Colds/Coughs/Allergies: °Benadryl (alcohol free) 25 mg every 6 hours as needed °Breath right strips °Claritin °Cepacol throat lozenges °Chloraseptic throat spray °Cold-Eeze- up to three times per day °Cough drops, alcohol free °Flonase (by prescription only) °Guaifenesin °Mucinex °Robitussin DM (plain only, alcohol free) °Saline nasal spray/drops °Sudafed (pseudoephedrine) & Actifed ** use only after [redacted] weeks gestation and if you do not have high blood pressure °Tylenol °Vicks Vaporub °Zinc lozenges °Zyrtec  ° °Constipation: °Colace °Ducolax suppositories °Fleet enema °Glycerin suppositories °Metamucil °Milk of magnesia °Miralax °Senokot °Smooth move tea ° °Diarrhea: °Kaopectate °Imodium A-D ° °*NO pepto Bismol ° °Hemorrhoids: °Anusol °Anusol HC °Preparation H °Tucks ° °Indigestion: °Tums °Maalox °Mylanta °Zantac  °Pepcid ° °Insomnia: °Benadryl (alcohol free) 25mg every 6 hours as needed °Tylenol  PM °Unisom, no Gelcaps ° °Leg Cramps: °Tums °MagGel ° °Nausea/Vomiting:  °Bonine °Dramamine °Emetrol °Ginger extract °Sea bands °Meclizine  °Nausea medication to take during pregnancy:  °Unisom (doxylamine succinate 25 mg tablets) Take one tablet daily at bedtime. If symptoms are not adequately controlled, the dose can be increased to a maximum recommended dose of two tablets daily (1/2 tablet in the morning, 1/2 tablet mid-afternoon and one at bedtime). °Vitamin B6 100mg tablets. Take one tablet twice a day (up to 200 mg per day). ° °Skin Rashes: °Aveeno products °Benadryl cream or 25mg every 6 hours as needed °Calamine Lotion °1% cortisone cream ° °Yeast infection: °Gyne-lotrimin 7 °Monistat 7 ° ° °**If taking multiple medications, please check labels to avoid duplicating the same active ingredients °**take medication as directed on the label °** Do not exceed 4000 mg of tylenol in 24 hours °**Do not take medications that contain aspirin or ibuprofen ° ° ° ° °

## 2015-06-01 NOTE — MAU Provider Note (Signed)
History     CSN: [id number]  Arrival date and time: 06/01/15 2142   First Provider Initiated Contact with Patient 06/01/15 2307      Chief Complaint  Patient presents with  . Abdominal Pain   Pelvic Pain The patient's primary symptoms include pelvic pain. This is a new problem. The current episode started in the past 7 days. The problem occurs intermittently. The problem has been gradually worsening. The pain is severe (8/10). The problem affects both sides. She is pregnant. Associated symptoms include abdominal pain. Pertinent negatives include no constipation, diarrhea, dysuria, fever, frequency, nausea, urgency or vomiting. The vaginal discharge was scant. The vaginal bleeding is spotting (pink spotting with wiping earlier today ). She has not been passing clots. She has not been passing tissue. Nothing aggravates the symptoms. She has tried nothing for the symptoms. She is sexually active. She uses nothing for contraception.    Past Medical History  Diagnosis Date  . Medical history non-contributory     Past Surgical History  Procedure Laterality Date  . No past surgeries      No family history on file.  Social History  Substance Use Topics  . Smoking status: Former Smoker -- 0.50 packs/day    Types: Cigarettes  . Smokeless tobacco: None  . Alcohol Use: Yes     Comment: occ    Allergies:  Allergies  Allergen Reactions  . Bactrim [Sulfamethoxazole-Trimethoprim] Hives  . Tomato Hives and Swelling    Prescriptions prior to admission  Medication Sig Dispense Refill Last Dose  . ciprofloxacin (CIPRO) 500 MG tablet Take 1 tablet (500 mg total) by mouth 2 (two) times daily. 10 tablet 0   . ibuprofen (ADVIL,MOTRIN) 600 MG tablet Take 1 tablet (600 mg total) by mouth every 6 (six) hours as needed. 30 tablet 0     Review of Systems  Constitutional: Negative for fever.  Gastrointestinal: Positive for abdominal pain. Negative for nausea, vomiting, diarrhea and  constipation.  Genitourinary: Positive for pelvic pain. Negative for dysuria, urgency and frequency.   Physical Exam   Blood pressure 129/76, pulse 99, temperature 98.5 F (36.9 C), temperature source Oral, resp. rate 18, height 5' 4.5" (1.638 m), weight 79.561 kg (175 lb 6.4 oz), last menstrual period 03/30/2015, SpO2 100 %.  Physical Exam  Nursing note and vitals reviewed. Constitutional: She is oriented to person, place, and time. She appears well-developed and well-nourished. No distress.  HENT:  Head: Normocephalic.  Cardiovascular: Normal rate.   Respiratory: Effort normal.  GI: Soft. There is no tenderness. There is no rebound.  Genitourinary:   External: no lesion Vagina: small amount of white discharge Cervix: pink, smooth, no CMT Uterus: NSSC Adnexa: NT   Neurological: She is alert and oriented to person, place, and time.  Skin: Skin is warm and dry.  Psychiatric: She has a normal mood and affect.   Results for orders placed or performed during the hospital encounter of 06/01/15 (from the past 24 hour(s))  Urinalysis, Routine w reflex microscopic (not at [facility])     Status: None   Collection Time: 06/01/15 10:42 PM  Result Value Ref Range   Color, Urine YELLOW YELLOW   APPearance CLEAR CLEAR   Specific Gravity, Urine 1.015 1.005 - 1.030   pH 8.0 5.0 - 8.0   Glucose, UA NEGATIVE NEGATIVE mg/dL   Hgb urine dipstick NEGATIVE NEGATIVE   Bilirubin Urine NEGATIVE NEGATIVE   Ketones, ur NEGATIVE NEGATIVE mg/dL   Protein, ur NEGATIVE NEGATIVE mg/dL  Urobilinogen, UA 0.2 0.0 - 1.0 mg/dL   Nitrite NEGATIVE NEGATIVE   Leukocytes, UA NEGATIVE NEGATIVE  Pregnancy, urine POC     Status: Abnormal   Collection Time: 06/01/15 10:48 PM  Result Value Ref Range   Preg Test, Ur POSITIVE (A) NEGATIVE  CBC     Status: Abnormal   Collection Time: 06/01/15 11:05 PM  Result Value Ref Range   WBC 7.5 4.0 - 10.5 K/uL   RBC 4.22 3.87 - 5.11 MIL/uL   Hemoglobin 11.5 (L) 12.0 - 15.0  g/dL   HCT 33.9 (L) 36.0 - 46.0 %   MCV 80.3 78.0 - 100.0 fL   MCH 27.3 26.0 - 34.0 pg   MCHC 33.9 30.0 - 36.0 g/dL   RDW 13.1 11.5 - 15.5 %   Platelets 250 150 - 400 K/uL  Wet prep, genital     Status: Abnormal   Collection Time: 06/01/15 11:15 PM  Result Value Ref Range   Yeast Wet Prep HPF POC NONE SEEN NONE SEEN   Trich, Wet Prep NONE SEEN NONE SEEN   Clue Cells Wet Prep HPF POC FEW (A) NONE SEEN   WBC, Wet Prep HPF POC FEW (A) NONE SEEN   US Ob Comp Less 14 Wks  06/01/2015  CLINICAL DATA:  29 year old female pregnant patient with lower abdominal pain since yesterday, and small amount of pink vaginal discharge. EXAM: OBSTETRIC <14 WK ULTRASOUND TECHNIQUE: Transabdominal ultrasound was performed for evaluation of the gestation as well as the maternal uterus and adnexal regions. COMPARISON:  Pelvic ultrasound 05/13/2012. FINDINGS: Intrauterine gestational sac: Single ovoid shaped gestational sac. Yolk sac:  Present. Embryo:  Present. Cardiac Activity: Present. Heart Rate: 173 bpm CRL:   28.1  mm   9 w 4 d                  [place] EDC: 12/31/2015 Maternal uterus/adnexae: No subchorionic hemorrhage. Bilateral ovaries are normal in appearance. No significant free fluid in the cul-de-sac. IMPRESSION: 1. Single viable IUP with estimated gestational age of [redacted] weeks and 4 days and fetal heart rate of 173 beats per minute. No acute findings. Electronically Signed   By: Vinnie Langton M.D.   On: 06/01/2015 23:43    MAU Course  Procedures  MDM   Assessment and Plan   1. [redacted] weeks gestation of pregnancy   2. Abdominal pain affecting pregnancy    DC home Comfort measures reviewed  1st Trimester precautions  Bleeding precautions RX: none  Return to MAU as needed FU with OB as planned  Follow-up Information    Schedule an appointment as soon as possible for a visit to follow up.   Contact information:   the provider of your choice from the list provided         Mathis Bud 06/01/2015, 11:16 PM

## 2015-06-01 NOTE — MAU Note (Signed)
Pt had a +upt at home. Started having some lower abdominal pain since yesterday-tonight much worse. Denies vag bleeding but noticed a small amount of pinkish discharge. LMP: end of august.

## 2015-06-02 LAB — GC/CHLAMYDIA PROBE AMP (~~LOC~~) NOT AT ARMC
Chlamydia: NEGATIVE
NEISSERIA GONORRHEA: NEGATIVE

## 2015-06-02 LAB — HCG, QUANTITATIVE, PREGNANCY: hCG, Beta Chain, Quant, S: 57659 m[IU]/mL — ABNORMAL HIGH (ref <5)

## 2015-06-02 LAB — RPR: RPR Ser Ql: NONREACTIVE

## 2015-06-02 LAB — HIV ANTIBODY (ROUTINE TESTING W REFLEX): HIV Screen 4th Generation wRfx: NONREACTIVE

## 2015-06-27 LAB — OB RESULTS CONSOLE ABO/RH: RH Type: POSITIVE

## 2015-06-27 LAB — OB RESULTS CONSOLE ANTIBODY SCREEN: Antibody Screen: NEGATIVE

## 2015-06-27 LAB — OB RESULTS CONSOLE GC/CHLAMYDIA
Chlamydia: NEGATIVE
Gonorrhea: NEGATIVE

## 2015-06-27 LAB — OB RESULTS CONSOLE RPR: RPR: NONREACTIVE

## 2015-06-27 LAB — OB RESULTS CONSOLE HEPATITIS B SURFACE ANTIGEN: Hepatitis B Surface Ag: NEGATIVE

## 2015-06-27 LAB — OB RESULTS CONSOLE RUBELLA ANTIBODY, IGM: RUBELLA: IMMUNE

## 2015-06-27 LAB — OB RESULTS CONSOLE HIV ANTIBODY (ROUTINE TESTING): HIV: NONREACTIVE

## 2015-08-07 NOTE — L&D Delivery Note (Signed)
Operative Delivery Note At 8:06 AM a viable female, "Carrie Krueger", was delivered via Vaginal, Spontaneous Delivery.  Presentation: vertex; Position: Occiput,, Anterior, with restitution to ROA.   SROM just prior to delivery, with light MSF noted.  Crowned over several pushes, then delivery of head at 8:04am.  Patient delivered with next contraction, approx 1 min later, with mild shoulder dystocia relieved with McRoberts and assisted delivery of anterior shoulder.  CAN noted, baby delivered through cord via somersault maneuver.  Baby slow to respond to stimulation, but had HR > 100 at all times.  Taken to warmer for further assessment and stimulation.  Color good.  See nurses notes for further information on interventions.    Delivery of the head: 01/14/2016  8:04 AM First maneuver:  McRoberts Second maneuver: Delivery of anterior shoulder No further maneuvers required.  APGAR: 6, 9; weight TBA   Placenta status: Intact, Spontaneous.  Sent to pathology. Cord: 3 vessels with the following complications: CAN x 1; Cord pH: NA  Anesthesia: None  Episiotomy: None Lacerations: Bilateral periurethral lacerations, hemostatic, no repair required Suture Repair: NA Est. Blood Loss (mL): 300  Mom to postpartum.  Baby to Nursery due to difficulty maintaining O2 sats without supplemental blow-by O2. Family plans outpatient circumcision. Patient undecided regarding contraception. Placenta to pathology.  Garison Genova, [place] 01/14/2016, 8:45 AM

## 2016-01-04 ENCOUNTER — Encounter (HOSPITAL_COMMUNITY): Payer: Self-pay | Admitting: *Deleted

## 2016-01-04 ENCOUNTER — Telehealth (HOSPITAL_COMMUNITY): Payer: Self-pay | Admitting: *Deleted

## 2016-01-04 LAB — OB RESULTS CONSOLE GBS: GBS: POSITIVE

## 2016-01-04 NOTE — Telephone Encounter (Signed)
Preadmission screen  

## 2016-01-12 ENCOUNTER — Other Ambulatory Visit: Payer: Self-pay | Admitting: Obstetrics and Gynecology

## 2016-01-13 DIAGNOSIS — O480 Post-term pregnancy: Secondary | ICD-10-CM | POA: Insufficient documentation

## 2016-01-13 DIAGNOSIS — D573 Sickle-cell trait: Secondary | ICD-10-CM | POA: Insufficient documentation

## 2016-01-13 DIAGNOSIS — B951 Streptococcus, group B, as the cause of diseases classified elsewhere: Secondary | ICD-10-CM | POA: Insufficient documentation

## 2016-01-13 NOTE — H&P (Signed)
Carrie Krueger is a 30 y.o. female, G3P1011 at 89 6/7 weeks, scheduled for induction at [facility] on 01/14/16 due to postdates (to arrive at 11:45pm on 01/13/16).  She has been followed with twice weekly BPPs since 41 weeks.  She wants a Microbiologist, and attended class, signed consents.  Denies leaking or bleeding, reports +FM.  Patient Active Problem List   Diagnosis Date Noted  . Positive GBS test 01/13/2016  . Sickle cell trait ([place]) 01/13/2016  . Post term pregnancy 01/13/2016  . Rh negative, maternal 01/13/2016  . Multiple skin nodules 01/06/2014    History of present pregnancy: Patient entered care at 13 3/7 weeks.   EDC of 12/31/15 was established by LMP, and in agreement with [place] at 9 4/7 weeks in MAU.   Anatomy scan:  19 5/7 weeks, with normal findings and an anterior placenta.   Additional [place] evaluations:  23 5/7 weeks:  Growth at 33 %ile, normal fluid, cervix 3.85, female. 40 3/7 weeks:  BPP 8/8, normal fluid 41 2/7 weeks:  BPP 8/8, AFI 18.43, 90%ile. 41 5/7 weeks:  BPP 8/8, vtx, AFI 11.77, 45%ile. Significant prenatal events:  Used marijuana in early pregnancy for nausea and appetite stimulation, stopped after 1st visit.  Desired waterbirth from early pregnancy, attended class and signed consents.  Declined induction at 41 weeks, agreeable to induction at 41 6/7 weeks.  Received TDAP and flu vaccine.  Rhophylac 10/24/15. Last evaluation: 41 5/7 weeks, cervix 1 cm, 50%, vtx, -2, BP 106/60, weight 207.   OB History    Gravida Para Term Preterm AB TAB SAB Ectopic Multiple Living   3 1 1  1  1   1     2004--SAB 2007--SVB, female, 8lbs, epidural, 22 hour labor, 40 weeks, oligohydramnios, [facility]  Past Medical History  Diagnosis Date  . Medical history non-contributory   . Headache   . Hx of varicella    Past Surgical History  Procedure Laterality Date  . No past surgeries     Family History: family history includes Sickle cell anemia in her maternal grandmother., son with  asthma  Social History:  reports that she has quit smoking. Her smoking use included Cigarettes. She smoked 0.50 packs per day. She does not have any smokeless tobacco history on file. She reports that she drinks alcohol. She reports that she does not use illicit drugs.  Hx marijuana in past, none since pregnancy.  Patient is African American, of the [place], high-school educated, employed as Chemical engineer, single, with FOB, [employer].   Prenatal Transfer Tool  Maternal Diabetes: No Genetic Screening: Normal 1st trimester screen and AFP Maternal Ultrasounds/Referrals: Normal Fetal Ultrasounds or other Referrals:  None Maternal Substance Abuse:  No Significant Maternal Medications:  None Significant Maternal Lab Results: Lab values include: Group B Strep positive, Rh negative  TDAP 10/06/15 Flu 06/28/15  ROS:  +FM, occasional UCs  Allergies  Allergen Reactions  . Bactrim [Sulfamethoxazole-Trimethoprim] Hives  . Tomato Hives and Swelling     Last menstrual period 03/30/2015.  Chest clear Heart RRR without murmur Abd gravid, NT, FH 39 cm Pelvic: 1 cm, 50%, vtx, -3 on last visit Ext: WNL  FHR: 150 at last visit UCs:  Occasional per patient report  Prenatal labs: ABO, Rh: O/Positive/-- (11/21 0000) Antibody: Negative (11/21 0000) Rubella:  Immune RPR: Nonreactive (11/21 0000)  HBsAg: Negative (11/21 0000)  HIV: Non-reactive (11/21 0000)  GBS: Positive (05/31 0000) on urine  Sickle cell/Hgb electrophoresis:  [place] trait  Pap:  06/28/15--BV, otherwise WNL GC:  Neg 06/28/15 Chlamydia:  Neg 06/28/15 Genetic screenings:  Normal 1st trimester screen and AFP Glucola:  WNL Other:   Hgb 12.5 at NOB, 11.1 at 28 weeks Urine culture positive for enterobacter asburiae 09/2015--f/u culture negative 12/30/15    Assessment/Plan: IUP at 41 6/7 weeks Desires waterbirth GBS positive [place] trait Rh negative  Plan: Admit to [facility] per consult with Dr. Mancel Bale Routine  CCOB orders Will evaluate for waterbirth candidacy at time of admission. PCN G for GBS prophylaxis. Risks and benefits of induction were reviewed with the patient at her last visit, including failure of method, prolonged labor, need for further intervention, risk of cesarean, options for induction (Cytotech, foley, pitocin).  Patient and family seem to understand these risks and wish to proceed. Decision regarding mode of induction will be made based on evaluation of FHR status, cervix, and contraction pattern at time of admission. Patient understands any risk issues will preclude utilization of waterbirth tub. May use pitocin to stimulate labor, then stop to allow utilization of waterbirth tub, as long as labor continues to progress.  Donnel Saxon, CNM, [place] 01/13/2016, 12:37 PM

## 2016-01-14 ENCOUNTER — Encounter (HOSPITAL_COMMUNITY): Payer: Self-pay

## 2016-01-14 ENCOUNTER — Inpatient Hospital Stay (HOSPITAL_COMMUNITY)
Admission: RE | Admit: 2016-01-14 | Discharge: 2016-01-16 | DRG: 775 | Disposition: A | Discharge status: Home / Self Care | Payer: Medicaid Other | Source: Ambulatory Visit | Attending: Obstetrics and Gynecology | Admitting: Obstetrics and Gynecology

## 2016-01-14 VITALS — BP 101/53 | HR 83 | Temp 98.5°F | Resp 18 | Ht 64.0 in | Wt 210.0 lb

## 2016-01-14 DIAGNOSIS — Z91018 Allergy to other foods: Secondary | ICD-10-CM

## 2016-01-14 DIAGNOSIS — O99824 Streptococcus B carrier state complicating childbirth: Secondary | ICD-10-CM | POA: Diagnosis present

## 2016-01-14 DIAGNOSIS — B951 Streptococcus, group B, as the cause of diseases classified elsewhere: Secondary | ICD-10-CM

## 2016-01-14 DIAGNOSIS — O360131 Maternal care for anti-D [Rh] antibodies, third trimester, fetus 1: Secondary | ICD-10-CM

## 2016-01-14 DIAGNOSIS — O480 Post-term pregnancy: Secondary | ICD-10-CM | POA: Diagnosis present

## 2016-01-14 DIAGNOSIS — Z832 Family history of diseases of the blood and blood-forming organs and certain disorders involving the immune mechanism: Secondary | ICD-10-CM

## 2016-01-14 DIAGNOSIS — Z87891 Personal history of nicotine dependence: Secondary | ICD-10-CM

## 2016-01-14 DIAGNOSIS — Z888 Allergy status to other drugs, medicaments and biological substances status: Secondary | ICD-10-CM

## 2016-01-14 DIAGNOSIS — D573 Sickle-cell trait: Secondary | ICD-10-CM | POA: Diagnosis present

## 2016-01-14 DIAGNOSIS — D62 Acute posthemorrhagic anemia: Secondary | ICD-10-CM | POA: Diagnosis not present

## 2016-01-14 DIAGNOSIS — Z3A41 41 weeks gestation of pregnancy: Secondary | ICD-10-CM | POA: Diagnosis not present

## 2016-01-14 DIAGNOSIS — O9902 Anemia complicating childbirth: Secondary | ICD-10-CM | POA: Diagnosis not present

## 2016-01-14 LAB — CBC
HEMATOCRIT: 30.4 % — AB (ref 36.0–46.0)
Hemoglobin: 10.6 g/dL — ABNORMAL LOW (ref 12.0–15.0)
MCH: 27.2 pg (ref 26.0–34.0)
MCHC: 34.9 g/dL (ref 30.0–36.0)
MCV: 78.1 fL (ref 78.0–100.0)
PLATELETS: 165 10*3/uL (ref 150–400)
RBC: 3.89 MIL/uL (ref 3.87–5.11)
RDW: 13.4 % (ref 11.5–15.5)
WBC: 7.3 10*3/uL (ref 4.0–10.5)

## 2016-01-14 LAB — TYPE AND SCREEN
ABO/RH(D): O POS
ANTIBODY SCREEN: NEGATIVE

## 2016-01-14 LAB — ABO/RH: ABO/RH(D): O POS

## 2016-01-14 LAB — RPR: RPR Ser Ql: NONREACTIVE

## 2016-01-14 MED ORDER — COCONUT OIL OIL
1.0000 "application " | TOPICAL_OIL | Status: DC | PRN
  Filled 2016-01-14: qty 120

## 2016-01-14 MED ORDER — WITCH HAZEL-GLYCERIN EX PADS: 1.0000 "application " | MEDICATED_PAD | CUTANEOUS | Status: DC | PRN

## 2016-01-14 MED ORDER — OXYTOCIN 40 UNITS IN LACTATED RINGERS INFUSION - SIMPLE MED: 1.0000 m[IU]/min | INTRAVENOUS | Status: DC

## 2016-01-14 MED ORDER — ONDANSETRON HCL 4 MG PO TABS: 4.0000 mg | ORAL_TABLET | ORAL | Status: DC | PRN

## 2016-01-14 MED ORDER — METHYLERGONOVINE MALEATE 0.2 MG/ML IJ SOLN
INTRAMUSCULAR | Status: AC
  Administered 2016-01-14: 0.2 mg
  Filled 2016-01-14: qty 1

## 2016-01-14 MED ORDER — ONDANSETRON HCL 4 MG/2ML IJ SOLN: 4.0000 mg | Freq: Four times a day (QID) | INTRAMUSCULAR | Status: DC | PRN

## 2016-01-14 MED ORDER — LACTATED RINGERS IV SOLN
INTRAVENOUS | Status: DC
  Administered 2016-01-14: 01:00:00 via INTRAVENOUS

## 2016-01-14 MED ORDER — DIBUCAINE 1 % RE OINT
1.0000 "application " | TOPICAL_OINTMENT | RECTAL | Status: DC | PRN
  Filled 2016-01-14: qty 28.4

## 2016-01-14 MED ORDER — PENICILLIN G POTASSIUM 5000000 UNITS IJ SOLR
5.0000 10*6.[IU] | Freq: Once | INTRAVENOUS | Status: AC
  Administered 2016-01-14: 5 10*6.[IU] via INTRAVENOUS
  Filled 2016-01-14: qty 5

## 2016-01-14 MED ORDER — METHYLERGONOVINE MALEATE 0.2 MG PO TABS
0.2000 mg | ORAL_TABLET | ORAL | Status: AC
  Administered 2016-01-14 – 2016-01-15 (×6): 0.2 mg via ORAL
  Filled 2016-01-14 (×6): qty 1

## 2016-01-14 MED ORDER — ZOLPIDEM TARTRATE 5 MG PO TABS: 5.0000 mg | ORAL_TABLET | Freq: Every evening | ORAL | Status: DC | PRN

## 2016-01-14 MED ORDER — TETANUS-DIPHTH-ACELL PERTUSSIS 5-2.5-18.5 LF-MCG/0.5 IM SUSP
0.5000 mL | Freq: Once | INTRAMUSCULAR | Status: DC
Start: 2016-01-15 — End: 2016-01-16
  Filled 2016-01-14: qty 0.5

## 2016-01-14 MED ORDER — LIDOCAINE HCL (PF) 1 % IJ SOLN
30.0000 mL | INTRAMUSCULAR | Status: DC | PRN
  Filled 2016-01-14: qty 30

## 2016-01-14 MED ORDER — OXYTOCIN BOLUS FROM INFUSION
500.0000 mL | INTRAVENOUS | Status: DC
  Administered 2016-01-14: 500 mL via INTRAVENOUS

## 2016-01-14 MED ORDER — MISOPROSTOL 25 MCG QUARTER TABLET
25.0000 ug | ORAL_TABLET | ORAL | Status: DC | PRN
  Administered 2016-01-14: 25 ug via VAGINAL
  Filled 2016-01-14: qty 0.25

## 2016-01-14 MED ORDER — ACETAMINOPHEN 325 MG PO TABS: 650.0000 mg | ORAL_TABLET | ORAL | Status: DC | PRN

## 2016-01-14 MED ORDER — SIMETHICONE 80 MG PO CHEW: 80.0000 mg | CHEWABLE_TABLET | ORAL | Status: DC | PRN

## 2016-01-14 MED ORDER — TERBUTALINE SULFATE 1 MG/ML IJ SOLN: 0.2500 mg | Freq: Once | INTRAMUSCULAR | Status: DC | PRN

## 2016-01-14 MED ORDER — LACTATED RINGERS IV SOLN: 500.0000 mL | INTRAVENOUS | Status: DC | PRN

## 2016-01-14 MED ORDER — FENTANYL CITRATE (PF) 100 MCG/2ML IJ SOLN: 50.0000 ug | INTRAMUSCULAR | Status: DC | PRN

## 2016-01-14 MED ORDER — SENNOSIDES-DOCUSATE SODIUM 8.6-50 MG PO TABS
2.0000 | ORAL_TABLET | ORAL | Status: DC
  Administered 2016-01-15: 2 via ORAL
  Filled 2016-01-14 (×2): qty 2

## 2016-01-14 MED ORDER — METHYLERGONOVINE MALEATE 0.2 MG PO TABS
0.2000 mg | ORAL_TABLET | Freq: Four times a day (QID) | ORAL | Status: DC
Start: 2016-01-14 — End: 2016-01-14

## 2016-01-14 MED ORDER — OXYCODONE HCL 5 MG PO TABS: 10.0000 mg | ORAL_TABLET | ORAL | Status: DC | PRN

## 2016-01-14 MED ORDER — SOD CITRATE-CITRIC ACID 500-334 MG/5ML PO SOLN: 30.0000 mL | ORAL | Status: DC | PRN

## 2016-01-14 MED ORDER — DIPHENHYDRAMINE HCL 25 MG PO CAPS: 25.0000 mg | ORAL_CAPSULE | Freq: Four times a day (QID) | ORAL | Status: DC | PRN

## 2016-01-14 MED ORDER — ONDANSETRON HCL 4 MG/2ML IJ SOLN: 4.0000 mg | INTRAMUSCULAR | Status: DC | PRN

## 2016-01-14 MED ORDER — OXYCODONE HCL 5 MG PO TABS: 5.0000 mg | ORAL_TABLET | ORAL | Status: DC | PRN

## 2016-01-14 MED ORDER — OXYTOCIN 40 UNITS IN LACTATED RINGERS INFUSION - SIMPLE MED
2.5000 [IU]/h | INTRAVENOUS | Status: DC
  Filled 2016-01-14: qty 1000

## 2016-01-14 MED ORDER — IBUPROFEN 600 MG PO TABS
600.0000 mg | ORAL_TABLET | Freq: Four times a day (QID) | ORAL | Status: DC
  Administered 2016-01-14 – 2016-01-16 (×7): 600 mg via ORAL
  Filled 2016-01-14 (×8): qty 1

## 2016-01-14 MED ORDER — FLEET ENEMA 7-19 GM/118ML RE ENEM: 1.0000 | ENEMA | RECTAL | Status: DC | PRN

## 2016-01-14 MED ORDER — PRENATAL MULTIVITAMIN CH: 1.0000 | ORAL_TABLET | Freq: Every day | ORAL | Status: DC

## 2016-01-14 MED ORDER — BENZOCAINE-MENTHOL 20-0.5 % EX AERO
1.0000 "application " | INHALATION_SPRAY | CUTANEOUS | Status: DC | PRN
  Filled 2016-01-14: qty 56

## 2016-01-14 MED ORDER — PENICILLIN G POTASSIUM 5000000 UNITS IJ SOLR
2.5000 10*6.[IU] | INTRAVENOUS | Status: DC
  Filled 2016-01-14 (×5): qty 2.5

## 2016-01-14 NOTE — Lactation Note (Signed)
This note was copied from a baby's chart. Lactation Consultation Note Initial visit at 51 hours of age.  Mom reports good feedings and denies pain with latch.  MOm reports seeing colostrum with hand expression and concerns that her breasts are not full yet.  LC advised as normal and discussed normal progression of milk volume.  [facility] LC resources given and discussed.  Encouraged to feed with early cues on demand.  Early newborn behavior discussed.   Mom to call Rn or LC for assist and LATCH score with next feeding.  Baby asleep in visitors arms.     Patient Name: Carrie Krueger [id number] Date: 01/14/2016 Reason for consult: Initial assessment   Maternal Data Has patient been taught Hand Expression?: Yes Does the patient have breastfeeding experience prior to this delivery?: Yes  Feeding Length of feed: 20 min  LATCH Score/Interventions                Intervention(s): Breastfeeding basics reviewed     Lactation Tools Discussed/Used WIC Program: Yes   Consult Status Date: 01/15/16 Follow-up type: In-patient    Kendell Bane Justine Null 01/14/2016, 8:18 PM

## 2016-01-14 NOTE — Anesthesia Pain Management Evaluation Note (Signed)
  CRNA Pain Management Visit Note  Patient: Carrie Krueger, 30 y.o., female  "Hello I am a member of the anesthesia team at [facility]. We have an anesthesia team available at all times to provide care throughout the hospital, including epidural management and anesthesia for C-section. I don't know your plan for the delivery whether it a natural birth, water birth, IV sedation, nitrous supplementation, doula or epidural, but we want to meet your pain goals."   1.Was your pain managed to your expectations on prior hospitalizations?   Unable to assess - patient pushing  2.What is your expectation for pain management during this hospitalization?     Labor support without medications  3.How can we help you reach that goal? Patient pushing when  Anesthesia rounding  Record the patient's initial score and the patient's pain goal.   Pain: 10  Pain Goal: 10 [facility] wants you to be able to say your pain was always managed very well.  Rayvon Char 01/14/2016

## 2016-01-14 NOTE — Progress Notes (Signed)
Subjective: Very uncomfortable, starting to feel increased pressure.    Objective: BP 117/65 mmHg  Pulse 84  Temp(Src) 98.7 F (37.1 C) (Oral)  Resp 16  Ht 5\' 4"  (1.626 m)  Wt 95.255 kg (210 lb)  BMI 36.03 kg/m2  LMP 03/26/2015      FHT: Category 1, 120 bpm, moderate variability, no accels,  UC:   regular, every 2 minutes SVE:   Dilation: 6.5 Effacement (%): 100 Station: -1 Exam by:: S Moyer/ R McCall Membranes: Intact, BBOW Induction/augmentation Cytotec: S/p x1 dose at 0111 Pitocin: none Internal monitor: none  GBS prophylaxis: PCN, 1X PCN  Assessment:  IUP at 42.0  weeks POst dates IOL Active labor Membranes intact, BBOW  [place] trait Rh negative GBS positive Cat 1 FT  Plan: Continue postdates induction Saline lock  Intermittent fetal monitoring if Cat1 FT and no recent IV pain medication May labor in tub if Cat 1   [place], [place] 01/14/2016, 0630

## 2016-01-14 NOTE — Progress Notes (Signed)
Subjective: Pt doing well but states she cannot get comfortable.  Feeling low pelvic cramping.   Objective: BP 119/65 mmHg  Pulse 77  Temp(Src) 98.2 F (36.8 C) (Oral)  Resp 18  Ht 5\' 4"  (1.626 m)  Wt 95.255 kg (210 lb)  BMI 36.03 kg/m2  LMP 03/26/2015    Filed Vitals:   01/14/16 0035  BP: 119/65  Pulse: 77  Temp: 98.2 F (36.8 C)  TempSrc: Oral  Resp: 18  Height: 5\' 4"  (1.626 m)  Weight: 95.255 kg (210 lb)      Results for orders placed or performed during the hospital encounter of 01/14/16 (from the past 24 hour(s))  CBC     Status: Abnormal   Collection Time: 01/14/16 12:48 AM  Result Value Ref Range   WBC 7.3 4.0 - 10.5 K/uL   RBC 3.89 3.87 - 5.11 MIL/uL   Hemoglobin 10.6 (L) 12.0 - 15.0 g/dL   HCT 30.4 (L) 36.0 - 46.0 %   MCV 78.1 78.0 - 100.0 fL   MCH 27.2 26.0 - 34.0 pg   MCHC 34.9 30.0 - 36.0 g/dL   RDW 13.4 11.5 - 15.5 %   Platelets 165 150 - 400 K/uL  Type and screen     Status: None   Collection Time: 01/14/16 12:48 AM  Result Value Ref Range   ABO/RH(D) O POS    Antibody Screen NEG    Sample Expiration 01/17/2016       FHT: Category 1, 135 bpm, moderate variability, accels, no decels UC:   irregular, every 5-8 minutes SVE:   Dilation: Closed Effacement (%): Thick Exam by:: S Moyer Membranes:   Intact Induction/augmentation Cytotec:  S/p x1 dose at 0111 Pitocin: none Internal monitor: none  GBS prophylaxis:  PCN when in active labor  Assessment:  IUP at 41 6/7 weeks POst dates IOL Cytotec x1 dose Desires waterbirth [place] trait Rh negative GBS positive Cat 1 FT  Plan: Continue postdates induction May have Saline lock Intermittent fetal monitoring if Cat1 FT and no recent IV pain medication Encourage to rest/sleep Reassess cervix at  0520  GBs prophylaxis when in labor May labor in tub in active labor   [place], [place] 01/14/2016, 3:01 AM

## 2016-01-15 LAB — CBC
HEMATOCRIT: 25.6 % — AB (ref 36.0–46.0)
HEMOGLOBIN: 8.7 g/dL — AB (ref 12.0–15.0)
MCH: 26.4 pg (ref 26.0–34.0)
MCHC: 34 g/dL (ref 30.0–36.0)
MCV: 77.8 fL — AB (ref 78.0–100.0)
Platelets: 163 10*3/uL (ref 150–400)
RBC: 3.29 MIL/uL — AB (ref 3.87–5.11)
RDW: 13.7 % (ref 11.5–15.5)
WBC: 12 10*3/uL — ABNORMAL HIGH (ref 4.0–10.5)

## 2016-01-15 MED ORDER — FERROUS SULFATE 325 (65 FE) MG PO TABS
325.0000 mg | ORAL_TABLET | Freq: Two times a day (BID) | ORAL | Status: DC
  Administered 2016-01-15 – 2016-01-16 (×3): 325 mg via ORAL
  Filled 2016-01-15 (×3): qty 1

## 2016-01-15 NOTE — Progress Notes (Signed)
Subjective: Postpartum Day 1: Vaginal delivery, bilateral periurethral lacerations, no repair required, waterbirth. Patient up ad lib, reports no syncope or dizziness. Feeding:  Breast Contraceptive plan:  Mirena IUD, previous user  Objective: Vital signs in last 24 hours: Temp:  [97.7 F (36.5 C)-99 F (37.2 C)] 97.7 F (36.5 C) (06/11 0700) Pulse Rate:  [78-104] 93 (06/11 0700) Resp:  [16-20] 18 (06/11 0700) BP: (99-120)/(51-76) 113/63 mmHg (06/11 0700) SpO2:  [99 %-100 %] 99 % (06/11 0220)   Orthostatics stable  Physical Exam:  General: alert Lochia: appropriate Uterine Fundus: firm Perineum: healing well DVT Evaluation: No evidence of DVT seen on physical exam. Negative Homan's sign.   CBC Latest Ref Rng 01/15/2016 01/14/2016 06/01/2015  WBC 4.0 - 10.5 K/uL 12.0(H) 7.3 7.5  Hemoglobin 12.0 - 15.0 g/dL 8.7(L) 10.6(L) 11.5(L)  Hematocrit 36.0 - 46.0 % 25.6(L) 30.4(L) 33.9(L)  Platelets 150 - 400 K/uL 163 165 250     Assessment/Plan: Status post vaginal delivery day 1. Anemia due to blood loss at delivery--stable hemodynamically Stable Continue current care. Fe BID Declines transfusion. Plan for discharge tomorrow    Donnel Saxon, [place] 01/15/2016, 9:01 AM

## 2016-01-15 NOTE — Lactation Note (Addendum)
This note was copied from a baby's chart. Lactation Consultation Note  Patient Name: Carrie Krueger [id number] Date: 01/15/2016 Reason for consult: Follow-up assessment   Follow up with Exp BF mom of 60 hour old infant. Infant with 9 BF for 15-20 minutes, 1 attempt, 1 void and 0 stools since birth. Infant with Light MSF at delivery. Infant weight 9 lb 1 oz with 1% weight loss at around 16 hours of age. LATCH Scores 7-8 by bedside RN.   Mom is concerned that infant is cluster feeding, reassured her that this is normal. Mom was BF infant when I went into room. Infant was dozing and needed stimulation to maintain suckling at times. He then fell asleep. Infant was placed in his crib and he awoke to feed again. Mom latched infant to left breast in football hold independently. He was noted to have flanged lips, rhythmic suckling and a few swallows. Discussed BF basics with mom. Infant diaper is clean and dry at this feeding. Infant tends to want to hang out at the breast with periods of non nutritive sucking. Enc mom to awaken him as needed during feeding. Mom with compressible breasts/areola and everted nipples.   Due to no stool in 29 hours decision was made for mom to begin pumping. Mom prefers infant does not get formula but is willing to give if it is in his best interest. Infant was seen by Ped within the last few hours. Mom was able to demonstrate hand expression, no colostrum noted to right breast, glistening noted to left breast.   DEBP set up with instructions for assembling, disassembling, pumping, cleaning, and milk storage with mom. Colostrum collection containers and curved tip syringe given to mom. Mom pumped left breast, no colostrum noted, infant was then latched on for 2nd half of feeding. Enc mom to pump post BF every 2-3 hours for 15 min on Initiate setting. All EBM should be fed to infant via spoon, mom voiced understanding. Spoons are in room, mom to call for assistance with spoon feeding  when needed.   Mom is a [facility] client and says she may have a pump for home use by the time she goes home. Enc mom to call with questions/concerns prn. POC to [place], Therapist, sports. Follow up tomorrow and prn.  Maternal Data Formula Feeding for Exclusion: No Has patient been taught Hand Expression?: Yes Does the patient have breastfeeding experience prior to this delivery?: Yes  Feeding Feeding Type: Breast Fed Length of feed: 20 min  LATCH Score/Interventions Latch: Grasps breast easily, tongue down, lips flanged, rhythmical sucking. Intervention(s): Adjust position;Assist with latch;Breast massage;Breast compression  Audible Swallowing: A few with stimulation Intervention(s): Skin to skin;Hand expression Intervention(s): Alternate breast massage  Type of Nipple: Everted at rest and after stimulation  Comfort (Breast/Nipple): Filling, red/small blisters or bruises, mild/mod discomfort  Problem noted: Mild/Moderate discomfort Interventions (Mild/moderate discomfort): Hand expression (EBM post feeds)  Hold (Positioning): Assistance needed to correctly position infant at breast and maintain latch. Intervention(s): Breastfeeding basics reviewed;Support Pillows;Position options;Skin to skin  LATCH Score: 7  Lactation Tools Discussed/Used WIC Program: Yes Pump Review: Setup, frequency, and cleaning;Milk Storage Initiated by:: Nonah Mattes, RN, IBCLC Date initiated:: 01/15/16   Consult Status Consult Status: Follow-up Date: 01/16/16 Follow-up type: In-patient    Carrie Krueger 01/15/2016, 2:11 PM

## 2016-01-16 MED ORDER — FERROUS SULFATE 325 (65 FE) MG PO TABS: 325.0000 mg | ORAL_TABLET | Freq: Two times a day (BID) | ORAL | Status: DC

## 2016-01-16 MED ORDER — IBUPROFEN 600 MG PO TABS: 600.0000 mg | ORAL_TABLET | Freq: Four times a day (QID) | ORAL | Status: DC

## 2016-01-16 NOTE — Progress Notes (Signed)
UR chart review completed.  

## 2016-01-16 NOTE — Lactation Note (Signed)
This note was copied from a baby's chart. Lactation Consultation Note: Experienced BF mom reports baby has been nursing well but is still fussy after nursing. Has been giving some formula after nursing. Has DEBP set up in room. Reports she pumped about 3 times yesterday. Encouraged to pump 4-6 times/day to stimulate milk supply. Plans to get pump from family member after DC. Baby fussy after MD check. Latched well and still nursing as I left room. No questions at present. Concerned that milk supply is not increasing. Encouraged frequent nursing and pumping. To call prn  Patient Name: Carrie Krueger [id number] Date: 01/16/2016 Reason for consult: Follow-up assessment   Maternal Data Formula Feeding for Exclusion: No Has patient been taught Hand Expression?: Yes Does the patient have breastfeeding experience prior to this delivery?: Yes  Feeding Feeding Type: Breast Fed  LATCH Score/Interventions Latch: Grasps breast easily, tongue down, lips flanged, rhythmical sucking.  Audible Swallowing: A few with stimulation  Type of Nipple: Everted at rest and after stimulation  Comfort (Breast/Nipple): Soft / non-tender     Hold (Positioning): No assistance needed to correctly position infant at breast.  LATCH Score: 9  Lactation Tools Discussed/Used     Consult Status Consult Status: Complete    Truddie Crumble 01/16/2016, 8:04 AM

## 2016-01-16 NOTE — Discharge Summary (Signed)
Vaginal Delivery Discharge Summary  Carrie Krueger  DOB:    [birth year] MRN:    [id number] CSN:    [id number]  Date of admission:                  01/13/2016  Date of discharge:                   01/16/2016  Procedures this admission:   Water birth vaginal delivery, Misoprostol induction. Artificial rupture of membranes (AROM)  Date of Delivery: 01/14/2016  Newborn Data:  Live born female  Birth Weight: 9 lb 2.9 oz (4165 g) APGAR: 6, 9  Home with mother. Circumcision Plan: Outpatient  History of Present Illness:  Ms. Manon Gellatly is a 30 y.o. female, [id number], who presents at 52 W 6 D gestation for labor induction for post term pregancy. The patient has been followed at [facility] of [facility]. She was admitted for labor induction for post term pregnancy. Her pregnancy has been complicated by:   Patient Active Problem List   Diagnosis Date Noted  . Post term pregnancy, antepartum condition or complication [id number]  . Vaginal delivery 01/14/2016  . Positive GBS test 01/13/2016  . Sickle cell trait (Post) 01/13/2016  . Post term pregnancy 01/13/2016  . Rh negative, maternal 01/13/2016  . Multiple skin nodules 01/06/2014   * Patient is Rh Positive.   Hospital Course:   GBS positive.  Progressed with misoprostol. Utilized nothing for pain management- water birth delivery.  Delivery was performed by Donnel Saxon, CNM with complication of shoulder dystocia resolved with McRoberts and suprapubic pressure. Patient and baby tolerated the procedure without difficulty, with  no laceration noted. Infant status was stable and remained in room with mother.  Mother used iron tablets for postpartum anemia.  Her infant had an uncomplicated postpartum course, with breastfeeding going well. Mom's physical exam was WNL, and she was discharged home in stable condition. Contraception plan was Mirena.  She received adequate benefit from po pain  medications.   Intrapartum Procedures: spontaneous vaginal delivery and GBS prophylaxis Postpartum Procedures: None  Complications-Operative and Postpartum: Shoulder dystocia.   Discharge Diagnoses: Term Pregnancy-delivered and Post-date pregnancy  Feeding:  breast  Contraception:  IUD  Hemoglobin Results:  CBC Latest Ref Rng 01/15/2016 01/14/2016 06/01/2015  WBC 4.0 - 10.5 K/uL 12.0(H) 7.3 7.5  Hemoglobin 12.0 - 15.0 g/dL 8.7(L) 10.6(L) 11.5(L)  Hematocrit 36.0 - 46.0 % 25.6(L) 30.4(L) 33.9(L)  Platelets 150 - 400 K/uL 163 165 250   Prenatal labs: ABO, Rh: O/Positive/-- (11/21 0000) Antibody: Negative (11/21 0000) Rubella: Immune RPR: Nonreactive (11/21 0000)  HBsAg: Negative (11/21 0000)  HIV: Non-reactive (11/21 0000)  GBS: Positive (05/31 0000) on urine  Sickle cell/Hgb electrophoresis: Dodson trait Pap: 06/28/15--BV, otherwise WNL GC: Neg 06/28/15 Chlamydia: Neg 06/28/15 Genetic screenings: Normal 1st trimester screen and AFP Glucola: WNL Other:  Hgb 12.5 at NOB, 11.1 at 28 weeks Urine culture positive for enterobacter asburiae 09/2015--f/u culture negative 12/30/15  Discharge Physical Exam:   General: alert, cooperative and no distress Lochia: appropriate Uterine Fundus: firm Incision: N/A DVT Evaluation: No evidence of DVT seen on physical exam. No significant calf/ankle edema.   Discharge Information:  Activity:           pelvic rest and No heavy lifting Diet:                routine Medications: Ibuprofen and Iron Condition:      stable  Instructions:  Routine pp instructions   Discharge to: home  Follow-up Information    Follow up with [facility]. Go in 6 weeks.   Specialty:  Obstetrics and Gynecology   Why:  Keep appointment as scheduled on 7/20.  Call for an earlier appointment if with insurance problems.    Contact information:   [address] [place]  [phone]     -To bring Baby for outpatient circumcision at office.    Alinda Dooms MD 01/16/2016 1:26 PM

## 2017-06-16 IMAGING — US US OB COMP LESS 14 WK
1 series · 15 of 28 positions shown · non-contrast
Comparison: Pelvic ultrasound 05/13/2012.

CLINICAL DATA: 29-year-old female pregnant patient with lower
abdominal pain since yesterday, and small amount of pink vaginal
discharge.

EXAM:
OBSTETRIC <14 WK ULTRASOUND
TECHNIQUE: Transabdominal ultrasound was performed for evaluation of the
gestation as well as the maternal uterus and adnexal regions.

[Series 1: us ob comp less 14 wk · 31 acquisitions, 15 frames shown]
[im 1/31]
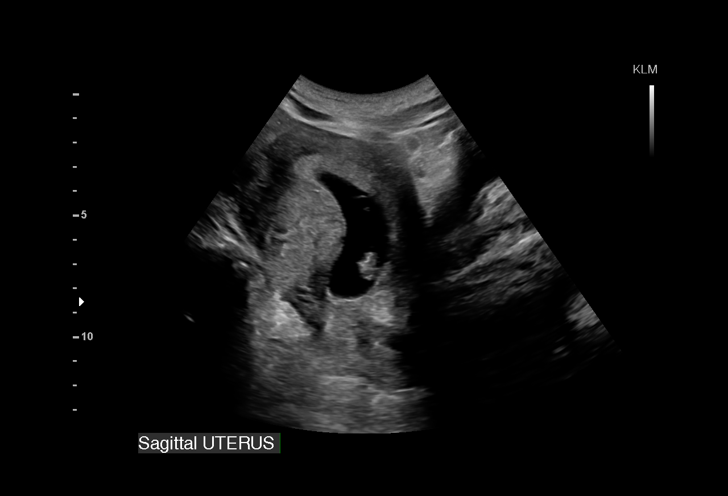
[im 3/31]
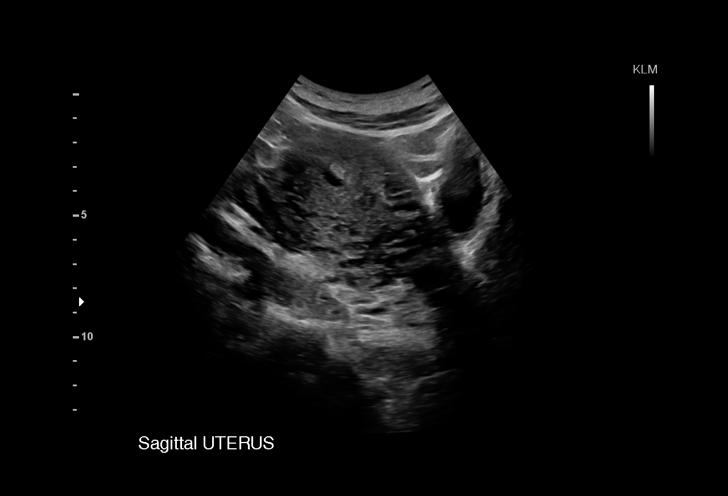
[im 5/31]
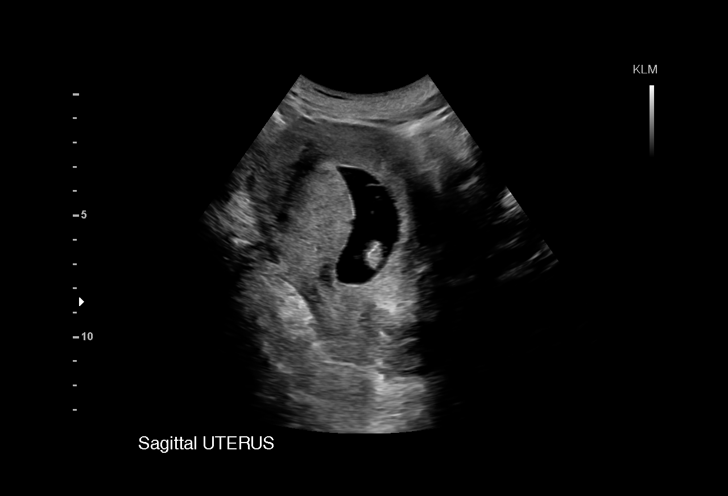
[im 7/31]
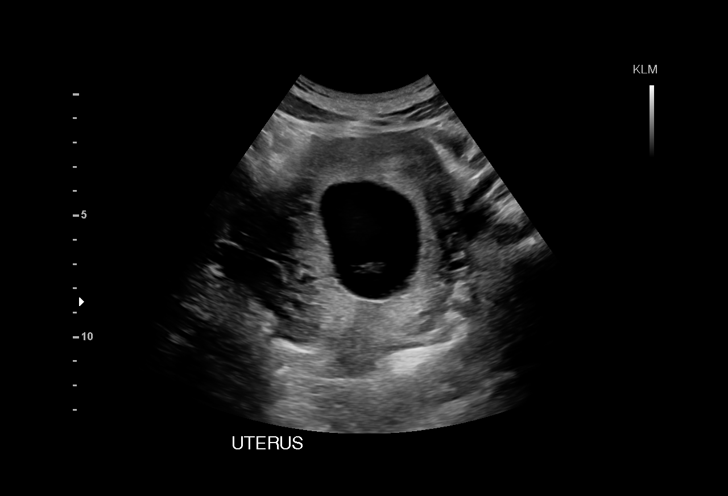
[im 9/31]
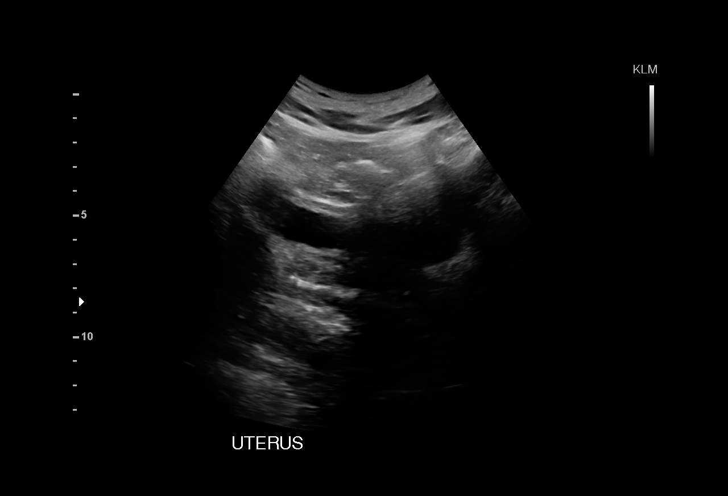
[im 12/31]
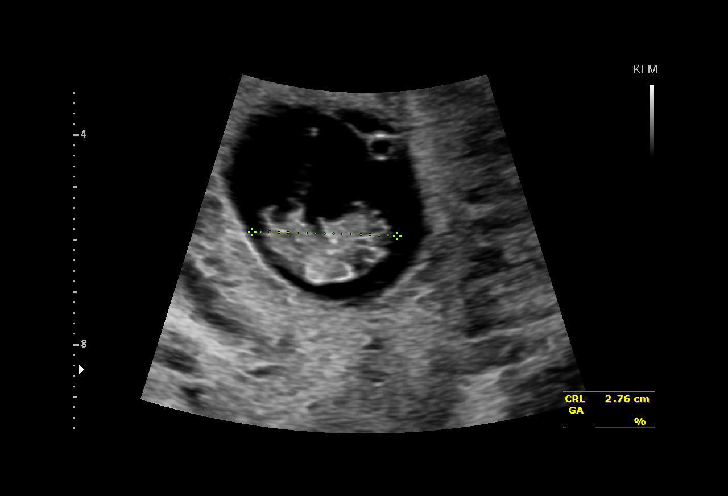
[im 14/31]
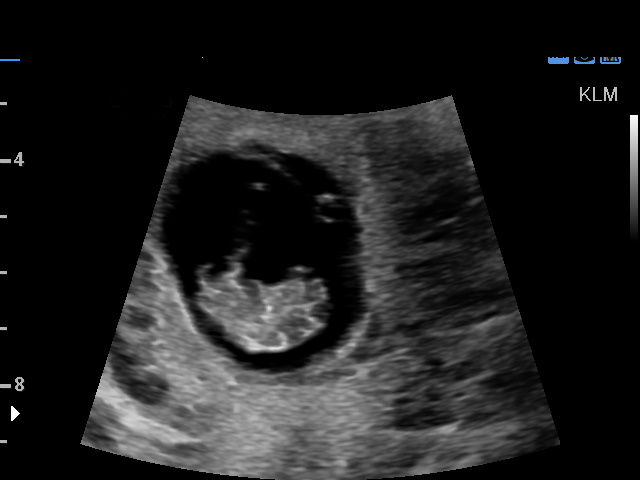
[im 16/31]
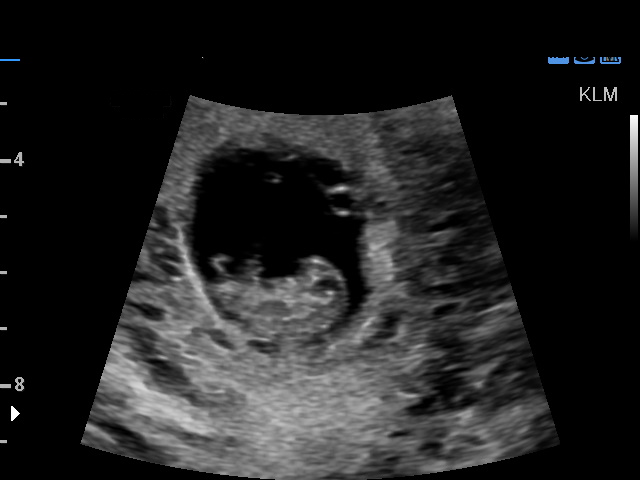
[im 17/31]
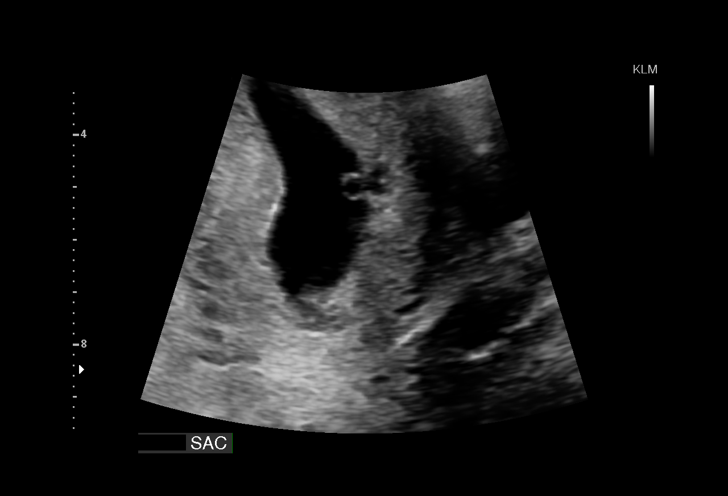
[im 19/31]
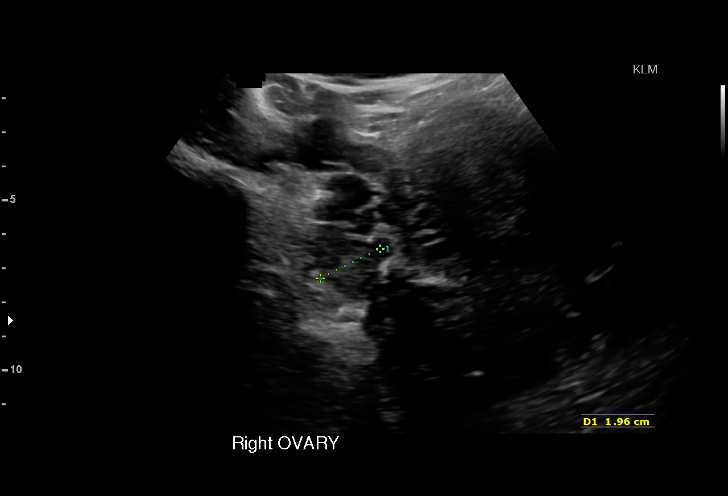
[im 22/31]
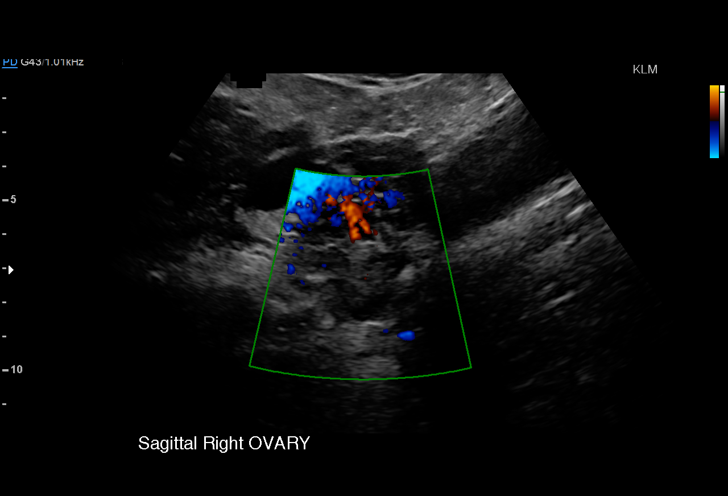
[im 24/31]
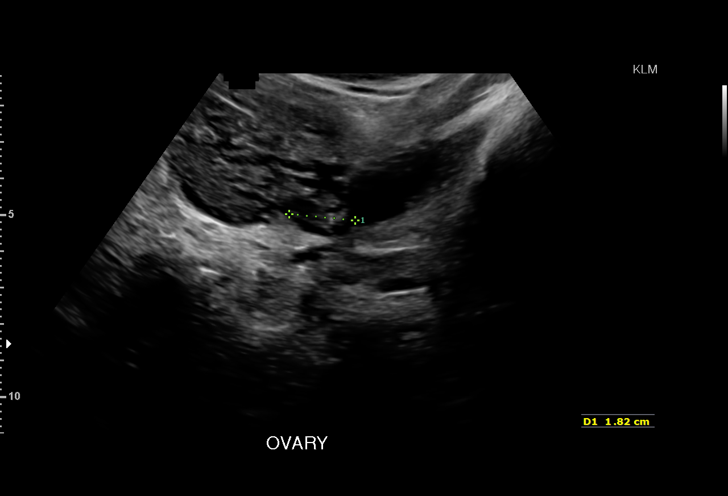
[im 26/31]
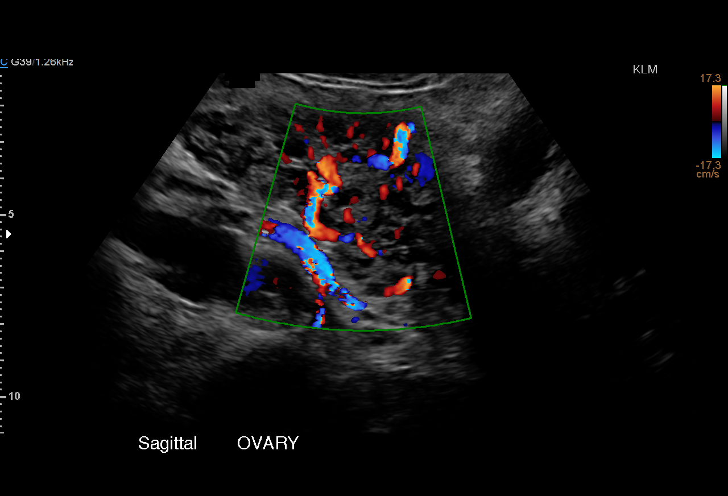
[im 28/31]
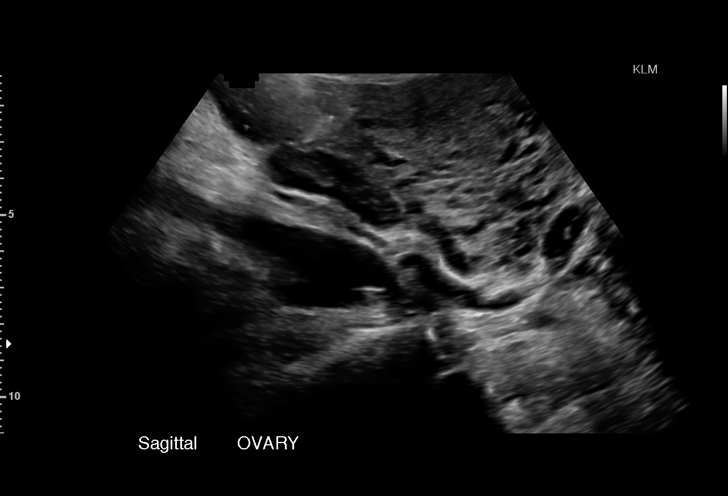
[im 31/31]
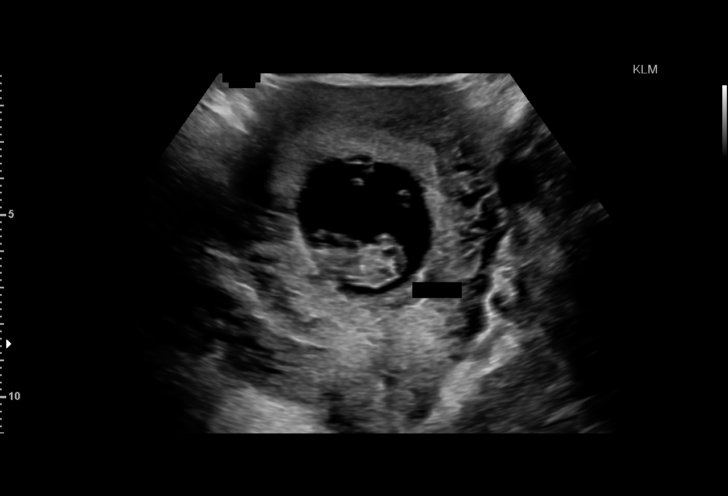

[15 of 28 positions shown; findings below may reference images not displayed]

FINDINGS: Intrauterine gestational sac: Single ovoid shaped gestational sac.

Yolk sac:  Present.

Embryo:  Present.

Cardiac Activity: Present.

Heart Rate: 173 bpm

CRL:   28.1  mm   9 w 4 d                  US EDC: 12/31/2015

Maternal uterus/adnexae: No subchorionic hemorrhage. Bilateral
ovaries are normal in appearance. No significant free fluid in the
cul-de-sac.
IMPRESSION: 1. Single viable IUP with estimated gestational age of 9 weeks and 4
days and fetal heart rate of 173 beats per minute. No acute
findings.

## 2020-04-07 ENCOUNTER — Other Ambulatory Visit: Payer: Self-pay | Admitting: Surgery

## 2020-05-02 ENCOUNTER — Other Ambulatory Visit: Payer: Self-pay | Admitting: Surgery

## 2020-06-03 ENCOUNTER — Other Ambulatory Visit: Payer: Self-pay | Admitting: Surgery

## 2020-07-21 ENCOUNTER — Ambulatory Visit: Admit: 2020-07-21 | Payer: Self-pay | Admitting: Surgery

## 2020-07-21 SURGERY — EXCISION, LESION, SCALP
Anesthesia: Monitor Anesthesia Care

## 2020-10-07 ENCOUNTER — Other Ambulatory Visit: Payer: Self-pay

## 2020-10-07 ENCOUNTER — Emergency Department (HOSPITAL_COMMUNITY): Payer: Managed Care, Other (non HMO)

## 2020-10-07 ENCOUNTER — Emergency Department (HOSPITAL_COMMUNITY)
Admission: EM | Admit: 2020-10-07 | Discharge: 2020-10-07 | Disposition: A | Discharge status: Home / Self Care | Payer: Managed Care, Other (non HMO) | Attending: Emergency Medicine | Admitting: Emergency Medicine

## 2020-10-07 ENCOUNTER — Encounter (HOSPITAL_COMMUNITY): Payer: Self-pay | Admitting: Emergency Medicine

## 2020-10-07 DIAGNOSIS — Z87891 Personal history of nicotine dependence: Secondary | ICD-10-CM | POA: Diagnosis not present

## 2020-10-07 DIAGNOSIS — A599 Trichomoniasis, unspecified: Secondary | ICD-10-CM | POA: Diagnosis not present

## 2020-10-07 DIAGNOSIS — R109 Unspecified abdominal pain: Secondary | ICD-10-CM | POA: Diagnosis present

## 2020-10-07 LAB — CBC
HCT: 36.8 % (ref 36.0–46.0)
Hemoglobin: 12.8 g/dL (ref 12.0–15.0)
MCH: 28.6 pg (ref 26.0–34.0)
MCHC: 34.8 g/dL (ref 30.0–36.0)
MCV: 82.3 fL (ref 80.0–100.0)
Platelets: 259 10*3/uL (ref 150–400)
RBC: 4.47 MIL/uL (ref 3.87–5.11)
RDW: 12.8 % (ref 11.5–15.5)
WBC: 5.4 10*3/uL (ref 4.0–10.5)
nRBC: 0 % (ref 0.0–0.2)

## 2020-10-07 LAB — LIPASE, BLOOD: Lipase: 69 U/L — ABNORMAL HIGH (ref 11–51)

## 2020-10-07 LAB — COMPREHENSIVE METABOLIC PANEL
ALT: 14 U/L (ref 0–44)
AST: 17 U/L (ref 15–41)
Albumin: 3.6 g/dL (ref 3.5–5.0)
Alkaline Phosphatase: 49 U/L (ref 38–126)
Anion gap: 7 (ref 5–15)
BUN: 5 mg/dL — ABNORMAL LOW (ref 6–20)
CO2: 24 mmol/L (ref 22–32)
Calcium: 9.3 mg/dL (ref 8.9–10.3)
Chloride: 105 mmol/L (ref 98–111)
Creatinine, Ser: 0.6 mg/dL (ref 0.44–1.00)
GFR, Estimated: >60 mL/min (ref >60)
Glucose, Bld: 105 mg/dL — ABNORMAL HIGH (ref 70–99)
Potassium: 3.3 mmol/L — ABNORMAL LOW (ref 3.5–5.1)
Sodium: 136 mmol/L (ref 135–145)
Total Bilirubin: 0.7 mg/dL (ref 0.3–1.2)
Total Protein: 6.5 g/dL (ref 6.5–8.1)

## 2020-10-07 LAB — URINALYSIS, ROUTINE W REFLEX MICROSCOPIC
Bacteria, UA: NONE SEEN
Bilirubin Urine: NEGATIVE
Glucose, UA: NEGATIVE mg/dL
Hgb urine dipstick: NEGATIVE
Ketones, ur: NEGATIVE mg/dL
Nitrite: NEGATIVE
Protein, ur: NEGATIVE mg/dL
Specific Gravity, Urine: 1.011 (ref 1.005–1.030)
pH: 7 (ref 5.0–8.0)

## 2020-10-07 LAB — HIV ANTIBODY (ROUTINE TESTING W REFLEX): HIV Screen 4th Generation wRfx: NONREACTIVE

## 2020-10-07 LAB — I-STAT BETA HCG BLOOD, ED (MC, WL, AP ONLY): I-stat hCG, quantitative: <5 m[IU]/mL (ref <5)

## 2020-10-07 LAB — WET PREP, GENITAL
Clue Cells Wet Prep HPF POC: NONE SEEN
Sperm: NONE SEEN
Yeast Wet Prep HPF POC: NONE SEEN

## 2020-10-07 LAB — RPR: RPR Ser Ql: NONREACTIVE

## 2020-10-07 MED ORDER — IOHEXOL 300 MG/ML  SOLN
100.0000 mL | Freq: Once | INTRAMUSCULAR | Status: AC | PRN
  Administered 2020-10-07: 100 mL via INTRAVENOUS

## 2020-10-07 MED ORDER — DEXTROSE 5 % IV SOLN: 500.0000 mg | Freq: Once | INTRAVENOUS | Status: DC

## 2020-10-07 MED ORDER — CEFTRIAXONE SODIUM 500 MG IJ SOLR
500.0000 mg | Freq: Once | INTRAMUSCULAR | Status: AC
  Administered 2020-10-07: 500 mg via INTRAMUSCULAR
  Filled 2020-10-07: qty 500

## 2020-10-07 MED ORDER — ONDANSETRON HCL 4 MG/2ML IJ SOLN
4.0000 mg | Freq: Once | INTRAMUSCULAR | Status: AC
  Administered 2020-10-07: 4 mg via INTRAVENOUS
  Filled 2020-10-07: qty 2

## 2020-10-07 MED ORDER — STERILE WATER FOR INJECTION IJ SOLN
INTRAMUSCULAR | Status: AC
  Filled 2020-10-07: qty 10

## 2020-10-07 MED ORDER — METRONIDAZOLE 500 MG PO TABS
2000.0000 mg | ORAL_TABLET | Freq: Once | ORAL | Status: AC
  Administered 2020-10-07: 2000 mg via ORAL
  Filled 2020-10-07: qty 4

## 2020-10-07 MED ORDER — DOXYCYCLINE HYCLATE 100 MG PO CAPS: 100.0000 mg | ORAL_CAPSULE | Freq: Two times a day (BID) | ORAL | 0 refills | Status: AC

## 2020-10-07 MED ORDER — DOXYCYCLINE HYCLATE 100 MG PO TABS
100.0000 mg | ORAL_TABLET | Freq: Once | ORAL | Status: AC
  Administered 2020-10-07: 100 mg via ORAL
  Filled 2020-10-07: qty 1

## 2020-10-07 NOTE — ED Provider Notes (Signed)
[facility] EMERGENCY DEPARTMENT Provider Note   CSN: [id number] Arrival date & time: 10/07/20  0008     History Chief Complaint  Patient presents with  . Abdominal Pain    Carrie Krueger is a 35 y.o. female with noncontributory past medical history.  No abdominal surgeries.  HPI Patient presents to emergency room today with chief complaint of abdominal pain x1 day.  Patient states she was having a bowel movement around 11 PM last night when she stood up she had severe lower abdominal pain.  She states the pain went around to her back and also felt like it was radiating to her rectum.  She describes the pain as sharp and throbbing.  The severe pain lasted approximately 30 minutes and still present although more tolerable.  She rates the pain currently 8 out of 10 in severity.  She does not take any over-the-counter medications for symptoms prior to arrival.  She denies any associated nausea or emesis.  Patient states she is also concerned because she cannot find her IUD strings.  She has had this IUD since 2017.  She admits to being sexually active with 2 female partners this year, has not been sexually active in the last 4 months.  She denies any history of STI.  She does endorse yellow discharge that she noticed yesterday. She denies history of similar abdominal pain.  Denies fever, chills, chest pain, shortness of breath, gross hematuria, urinary frequency, abnormal vaginal bleeding, diarrhea, blood in stool. She drinks alcohol occasionally, none recently.   Past Medical History:  Diagnosis Date  . Headache   . Hx of varicella   . Medical history non-contributory     Patient Active Problem List   Diagnosis Date Noted  . Post term pregnancy, antepartum condition or complication 46/50/3546  . Vaginal delivery 01/14/2016  . Positive GBS test 01/13/2016  . Sickle cell trait ([place]) 01/13/2016  . Post term pregnancy 01/13/2016  . Multiple skin nodules 01/06/2014    Past  Surgical History:  Procedure Laterality Date  . NO PAST SURGERIES       OB History    Gravida  3   Para  2   Term  2   Preterm      AB  1   Living  2     SAB  1   IAB      Ectopic      Multiple  0   Live Births  2           Family History  Problem Relation Age of Onset  . Sickle cell anemia Maternal Grandmother     Social History   Tobacco Use  . Smoking status: Former Smoker    Packs/day: 0.50    Types: Cigarettes  Substance Use Topics  . Alcohol use: Yes    Comment: occ  . Drug use: No    Home Medications Prior to Admission medications   Medication Sig Start Date End Date Taking? Authorizing Provider  doxycycline (VIBRAMYCIN) 100 MG capsule Take 1 capsule (100 mg total) by mouth 2 (two) times daily for 7 days. 10/07/20 10/14/20 Yes Walisiewicz, Leily Capek E, PA-C  levonorgestrel (MIRENA, 52 MG,) 20 MCG/24HR IUD 1 Intra Uterine Device.   Yes [provider]    Allergies    Bactrim [sulfamethoxazole-trimethoprim] and Tomato  Review of Systems   Review of Systems All other systems are reviewed and are negative for acute change except as noted in the HPI.  Physical Exam Updated Vital Signs BP 108/74 (BP Location: Right Arm)   Pulse 72   Temp 98 F (36.7 C) (Oral)   Resp 17   SpO2 100%   Physical Exam Vitals and nursing note reviewed.  Constitutional:      General: She is not in acute distress.    Appearance: She is not ill-appearing.  HENT:     Head: Normocephalic and atraumatic.     Right Ear: External ear normal.     Left Ear: External ear normal.     Nose: Nose normal.     Mouth/Throat:     Mouth: Mucous membranes are moist.     Pharynx: Oropharynx is clear.  Eyes:     General: No scleral icterus.       Right eye: No discharge.        Left eye: No discharge.     Extraocular Movements: Extraocular movements intact.     Conjunctiva/sclera: Conjunctivae normal.     Pupils: Pupils are equal, round, and reactive to light.   Neck:     Vascular: No JVD.  Cardiovascular:     Rate and Rhythm: Normal rate and regular rhythm.     Pulses: Normal pulses.          Radial pulses are 2+ on the right side and 2+ on the left side.     Heart sounds: Normal heart sounds.  Pulmonary:     Comments: Lungs clear to auscultation in all fields. Symmetric chest rise. No wheezing, rales, or rhonchi. Abdominal:     Tenderness: There is no right CVA tenderness or left CVA tenderness.     Comments: Abdomen is soft, non-distended, non tender to all quadrants.  No rigidity, no guarding. No peritoneal signs.  Genitourinary:    Comments: Normal external genitalia. No pain with speculum insertion. Closed cervical os with normal appearance - no rash or lesions. No bleeding noted from cervix or in vaginal vault. Copious thin yellow vaginal discharge coming from cervix. Unable to visualize IUD strings. On bimanual examination no adnexal tenderness or cervical motion tenderness. Chaperone Johana NT present during exam.  Musculoskeletal:        General: Normal range of motion.     Cervical back: Normal range of motion.  Skin:    General: Skin is warm and dry.     Capillary Refill: Capillary refill takes less than 2 seconds.  Neurological:     Mental Status: She is oriented to person, place, and time.     GCS: GCS eye subscore is 4. GCS verbal subscore is 5. GCS motor subscore is 6.     Comments: Fluent speech, no facial droop.  Psychiatric:        Behavior: Behavior normal.     ED Results / Procedures / Treatments   Labs (all labs ordered are listed, but only abnormal results are displayed) Labs Reviewed  WET PREP, GENITAL - Abnormal; Notable for the following components:      Result Value   Trich, Wet Prep PRESENT (*)    WBC, Wet Prep HPF POC MANY (*)    All other components within normal limits  LIPASE, BLOOD - Abnormal; Notable for the following components:   Lipase 69 (*)    All other components within normal limits   COMPREHENSIVE METABOLIC PANEL - Abnormal; Notable for the following components:   Potassium 3.3 (*)    Glucose, Bld 105 (*)    BUN 5 (*)    All other  components within normal limits  URINALYSIS, ROUTINE W REFLEX MICROSCOPIC - Abnormal; Notable for the following components:   Leukocytes,Ua SMALL (*)    All other components within normal limits  CBC  HIV ANTIBODY (ROUTINE TESTING W REFLEX)  RPR  I-STAT BETA HCG BLOOD, ED ([facility], [facility], [facility] ONLY)  GC/CHLAMYDIA PROBE AMP ([place]) NOT AT [facility]    EKG None  Radiology CT ABDOMEN PELVIS W CONTRAST  Result Date: 10/07/2020 CLINICAL DATA:  Right lower quadrant pain for several hours EXAM: CT ABDOMEN AND PELVIS WITH CONTRAST TECHNIQUE: Multidetector CT imaging of the abdomen and pelvis was performed using the standard protocol following bolus administration of intravenous contrast. CONTRAST:  159mL OMNIPAQUE IOHEXOL 300 MG/ML  SOLN COMPARISON:  None FINDINGS: Lower chest: No acute abnormality. Hepatobiliary: No focal liver abnormality is seen. No gallstones, gallbladder wall thickening, or biliary dilatation. Pancreas: Unremarkable. No pancreatic ductal dilatation or surrounding inflammatory changes. Spleen: Normal in size without focal abnormality. Adrenals/Urinary Tract: Adrenal glands are within normal limits. Kidneys demonstrate a normal enhancement pattern bilaterally. No renal calculi or obstructive changes are seen. Mild fullness of the left ureter is noted likely related to extrinsic compression from the uterus as well as a distended bladder. The bladder is within normal limits. Stomach/Bowel: The appendix is well visualized and within normal limits. No appendiceal dilatation or periappendiceal inflammatory changes are noted to suggest appendicitis. Small bowel and stomach are within normal limits. Colon shows no focal abnormality. Vascular/Lymphatic: No significant vascular findings are present. No enlarged abdominal or pelvic lymph nodes.  Reproductive: Uterus shows a 2.4 cm peripherally enhancing hypodensity consistent with uterine fibroid. This lies eccentric to the left in the anterior wall of the uterus. IUD is noted in place. No ovarian abnormality is seen. Other: No abdominal wall hernia or abnormality. No abdominopelvic ascites. Musculoskeletal: No acute or significant osseous findings. IMPRESSION: Normal-appearing appendix. Uterine fibroid without complicating factors. Mild fullness of the left renal collecting system and ureter without obstructing stone. This is felt to be related to distended bladder and mild extrinsic compression by the uterus deviated to the left. Electronically Signed   By: Inez Catalina M.D.   On: 10/07/2020 09:14    Procedures Procedures   Medications Ordered in ED Medications  sterile water (preservative free) injection (has no administration in time range)  iohexol (OMNIPAQUE) 300 MG/ML solution 100 mL (100 mLs Intravenous Contrast Given 10/07/20 0852)  metroNIDAZOLE (FLAGYL) tablet 2,000 mg (2,000 mg Oral Given 10/07/20 0944)  doxycycline (VIBRA-TABS) tablet 100 mg (100 mg Oral Given 10/07/20 0944)  ondansetron (ZOFRAN) injection 4 mg (4 mg Intravenous Given 10/07/20 0942)  cefTRIAXone (ROCEPHIN) injection 500 mg (500 mg Intramuscular Given 10/07/20 0946)    ED Course  I have reviewed the triage vital signs and the nursing notes.  Pertinent labs & imaging results that were available during my care of the patient were reviewed by me and considered in my medical decision making (see chart for details).    MDM Rules/Calculators/A&P                          History provided by patient with additional history obtained from chart review.    Patient presents to the ED with complaints of abdominal pain. Patient nontoxic appearing, in no apparent distress, vitals WNL at the time of my exam.  She was noted to be tachycardic to 102 in triage.  On exam patient has no abdominal tenderness, no CVA tenderness, no  peritoneal signs. Patient declines need for analgesics. Labs reviewed and grossly unremarkable. No leukocytosis, no anemia, no significant electrolyte derangement.  LFTs, renal function WNL. Lipase elevated at 69. Urinalysis without obvious infection.  Pregnancy test is negative. Given elevated lipase and report of severe abdominal painCT AP obtained and shows normal pancreas, uterine fibroid, normal appendix, IUD in place. Pelvic exam performed and shows copious discharge coming from the cervix that is yellow in color and thin.  Unable to visualize IUD strings.  No cervical motion or adnexal tenderness. Wet prep is positive for trich. Treated with flagyl as well as rocephin and doxycyline to cover for possible GC. Patient aware she will need to inform partners of positive test results. Discharged with prescription for doxycyline. Feel that PID is less likely given she is afebrile, no abdominal tenderness on serial exams, has no leukocytosis therefore treating with 1 week of doxycycline for possible chlamydia based on CDC guidelines.  Patient tolerating PO in the emergency department. Will discharge home with supportive measures. I discussed results, treatment plan, need for gyn follow-up, and return precautions with the patient. Provided opportunity for questions, patient confirmed understanding and is in agreement with plan.    Portions of this note were generated with [employer]. Dictation errors may occur despite best attempts at proofreading.   Final Clinical Impression(s) / ED Diagnoses Final diagnoses:  Trichimoniasis    Rx / DC Orders ED Discharge Orders         Ordered    doxycycline (VIBRAMYCIN) 100 MG capsule  2 times daily        10/07/20 0942           Barrie Folk, PA-C 10/07/20 1042    Sherwood Gambler, MD 10/13/20 1013

## 2020-10-07 NOTE — ED Triage Notes (Signed)
Patient reports hypogastric pain with diarrhea this evening , no emesis or fever .

## 2020-10-07 NOTE — ED Notes (Signed)
States she was having a BM approx. 11pm last night and after when she went to stand up off the commode she exp. Severe lower abd. Pain radiating around to her rectum.  States after 30 mins pain still didn't go away, she came into the ED after being her all night pain has eased up some however she can still feel it.

## 2020-10-07 NOTE — Discharge Instructions (Addendum)
-  The CT scan shows you have a fibroid on your uterus. You should follow up with gyn doctor about this.   Today you had a positive test for STD trich. Gonorrhea and chlamydia tests are pending.  You were treated for gonorrhea already with IV antibiotic. Prescription for doxycycline sent to the pharmacy. Take these while you wait for the result of chlamydia test. You had your first dose in the emergency room, so take next one tonight. You will get a phone call if positive test results and results will be available in your MyChart.  You should tell partners so they can be treated.

## 2020-10-10 LAB — GC/CHLAMYDIA PROBE AMP (~~LOC~~) NOT AT ARMC
Chlamydia: NEGATIVE
Comment: NEGATIVE
Comment: NORMAL
Neisseria Gonorrhea: NEGATIVE

## 2021-10-30 ENCOUNTER — Encounter (HOSPITAL_COMMUNITY): Payer: Self-pay | Admitting: Emergency Medicine

## 2021-10-30 ENCOUNTER — Other Ambulatory Visit: Payer: Self-pay

## 2021-10-30 ENCOUNTER — Emergency Department (HOSPITAL_COMMUNITY)
Admission: EM | Admit: 2021-10-30 | Discharge: 2021-10-30 | Disposition: A | Discharge status: Home / Self Care | Payer: 59 | Attending: Emergency Medicine | Admitting: Emergency Medicine

## 2021-10-30 ENCOUNTER — Emergency Department (HOSPITAL_COMMUNITY): Payer: 59

## 2021-10-30 DIAGNOSIS — R0789 Other chest pain: Secondary | ICD-10-CM | POA: Insufficient documentation

## 2021-10-30 DIAGNOSIS — M546 Pain in thoracic spine: Secondary | ICD-10-CM | POA: Insufficient documentation

## 2021-10-30 DIAGNOSIS — M549 Dorsalgia, unspecified: Secondary | ICD-10-CM | POA: Diagnosis present

## 2021-10-30 LAB — CBC
HCT: 37.6 % (ref 36.0–46.0)
Hemoglobin: 13.2 g/dL (ref 12.0–15.0)
MCH: 28.9 pg (ref 26.0–34.0)
MCHC: 35.1 g/dL (ref 30.0–36.0)
MCV: 82.5 fL (ref 80.0–100.0)
Platelets: 264 10*3/uL (ref 150–400)
RBC: 4.56 MIL/uL (ref 3.87–5.11)
RDW: 13 % (ref 11.5–15.5)
WBC: 4.9 10*3/uL (ref 4.0–10.5)
nRBC: 0 % (ref 0.0–0.2)

## 2021-10-30 LAB — BASIC METABOLIC PANEL
Anion gap: 8 (ref 5–15)
BUN: 8 mg/dL (ref 6–20)
CO2: 25 mmol/L (ref 22–32)
Calcium: 9.2 mg/dL (ref 8.9–10.3)
Chloride: 104 mmol/L (ref 98–111)
Creatinine, Ser: 0.72 mg/dL (ref 0.44–1.00)
GFR, Estimated: >60 mL/min (ref >60)
Glucose, Bld: 96 mg/dL (ref 70–99)
Potassium: 3.8 mmol/L (ref 3.5–5.1)
Sodium: 137 mmol/L (ref 135–145)

## 2021-10-30 LAB — TROPONIN I (HIGH SENSITIVITY): Troponin I (High Sensitivity): <2 ng/L (ref <18)

## 2021-10-30 MED ORDER — METHOCARBAMOL 500 MG PO TABS: 500.0000 mg | ORAL_TABLET | Freq: Three times a day (TID) | ORAL | 0 refills | Status: AC | PRN

## 2021-10-30 NOTE — ED Provider Notes (Signed)
?[name] ?Provider Note ? ? ?CSN: [id number] ?Arrival date & time: 10/30/21  8841 ? ?  ? ?History ? ?Chief Complaint  ?Patient presents with  ? Chest Pain  ? Back Pain  ? ? ?Carrie Krueger is a 36 y.o. female. ? ?The history is provided by the patient.  ?Chest Pain ?Associated symptoms: back pain   ?Back Pain ?Associated symptoms: chest pain   ?Patient presents with left-sided chest pain and back pain.  Patient presents with chest pain and back pain.  Began about a week ago.  Worse with certain movements.  No rash.  It is in the back and goes around to the front.  No swelling in her legs.  No fevers.  No cough.  Does occasionally smoke cigarettes. ?  ? ?Home Medications ?Prior to Admission medications   ?Medication Sig Start Date End Date Taking? Authorizing Provider  ?levonorgestrel (MIRENA, 52 MG,) 20 MCG/24HR IUD 1 Intra Uterine Device.   Yes [provider]  ?Menthol (BIOFREEZE) 5 % PTCH Apply 1 patch topically daily as needed (back pain).   Yes [provider]  ?methocarbamol (ROBAXIN) 500 MG tablet Take 1 tablet (500 mg total) by mouth every 8 (eight) hours as needed for muscle spasms. 10/30/21  Yes Davonna Belling, MD  ?Multiple Vitamin (MULTIVITAMIN) tablet Take 1 tablet by mouth daily.   Yes [provider]  ?   ? ?Allergies    ?Bactrim [sulfamethoxazole-trimethoprim] and Tomato   ? ?Review of Systems   ?Review of Systems  ?Cardiovascular:  Positive for chest pain.  ?Musculoskeletal:  Positive for back pain.  ? ?Physical Exam ?Updated Vital Signs ?BP 107/76   Pulse 66   Temp 98.5 ?F (36.9 ?C) (Oral)   Resp 19   SpO2 100%  ?Physical Exam ?Cardiovascular:  ?   Rate and Rhythm: Normal rate.  ?Pulmonary:  ?   Breath sounds: No wheezing.  ?Chest:  ?   Chest wall: Tenderness present.  ?   Comments: Tenderness to posterior back in the thoracic area.  No rash. ?Abdominal:  ?   Tenderness: There is no abdominal tenderness.  ?Musculoskeletal:  ?    Cervical back: Neck supple.  ?   Right lower leg: No edema.  ?   Left lower leg: No edema.  ?Skin: ?   General: Skin is warm.  ?Neurological:  ?   Mental Status: She is alert.  ? ? ?ED Results / Procedures / Treatments   ?Labs ?(all labs ordered are listed, but only abnormal results are displayed) ?Labs Reviewed  ?BASIC METABOLIC PANEL  ?CBC  ?TROPONIN I (HIGH SENSITIVITY)  ?TROPONIN I (HIGH SENSITIVITY)  ? ? ?EKG ?EKG Interpretation ? ?Date/Time:  Monday October 30 2021 09:34:58 EDT ?Ventricular Rate:  72 ?PR Interval:  128 ?QRS Duration: 77 ?QT Interval:  350 ?QTC Calculation: 383 ?R Axis:   58 ?Text Interpretation: Sinus rhythm Confirmed by Davonna Belling [id number]) on 10/31/2021 7:00:57 AM ? ?Radiology ?DG Chest 2 View ? ?Result Date: 10/30/2021 ?CLINICAL DATA:  chest pain EXAM: CHEST - 2 VIEW COMPARISON:  Jan 02, 2014. FINDINGS: No consolidation. No visible pleural effusions or pneumothorax. Cardiomediastinal silhouette is within normal limits. No evidence of acute osseous abnormality. IMPRESSION: No active cardiopulmonary disease. Electronically Signed   By: Margaretha Sheffield M.D.   On: 10/30/2021 10:18   ? ?Procedures ?Procedures  ? ? ?Medications Ordered in ED ?Medications - No data to display ? ?ED Course/ Medical Decision Making/ A&P ?  ?                        ?  Medical Decision Making ?Amount and/or Complexity of Data Reviewed ?Labs: ordered. ?Radiology: ordered. ? ?Risk ?Prescription drug management. ? ? ?Patient with chest and back pain.  Initial differential diagnosis is long and includes life-threatening conditions including aortic dissection and coronary artery disease.  Chest x-ray independently interpreted and reassuring.  EKG also reassuring and independently interpreted.  Troponin negative.  Has had pain for a week.  Pain is high and doubt urinary cause.  Worse with certain movements.  Most likely musculoskeletal.  Will treat as such.  Appears stable for discharge home. ? ? ? ? ? ? ? ?Final Clinical  Impression(s) / ED Diagnoses ?Final diagnoses:  ?Acute left-sided thoracic back pain  ? ? ?Rx / DC Orders ?ED Discharge Orders   ? ?      Ordered  ?  methocarbamol (ROBAXIN) 500 MG tablet  Every 8 hours PRN       ? 10/30/21 1108  ? ?  ?  ? ?  ? ? ?  ?Davonna Belling, MD ?10/31/21 0701 ? ?

## 2021-10-30 NOTE — ED Notes (Signed)
Patient transported to X-ray 

## 2021-10-30 NOTE — ED Triage Notes (Signed)
Patient coming from home, complaint of back and chest pain x1 week. Pt endorses worse this morning.  ?

## 2021-10-30 NOTE — ED Notes (Signed)
Pt verbalizes understanding of discharge instructions. Opportunity for questions and answers were provided. Pt discharged from the ED.   ?

## 2022-07-20 ENCOUNTER — Ambulatory Visit (HOSPITAL_COMMUNITY)
Admission: EM | Admit: 2022-07-20 | Discharge: 2022-07-20 | Disposition: A | Discharge status: Home / Self Care | Payer: 59 | Attending: Family Medicine | Admitting: Family Medicine

## 2022-07-20 ENCOUNTER — Encounter (HOSPITAL_COMMUNITY): Payer: Self-pay

## 2022-07-20 DIAGNOSIS — J029 Acute pharyngitis, unspecified: Secondary | ICD-10-CM

## 2022-07-20 LAB — POCT RAPID STREP A, ED / UC: Streptococcus, Group A Screen (Direct): NEGATIVE

## 2022-07-20 NOTE — Discharge Instructions (Signed)
You were seen today for sore throat.  Your strep test was negative in the office.  I have sent the swab for culture and this will be resulted in several days.  If this ends up being positive then we will call you to start antibiotics.  In the mean time this seems viral to me.  I recommend tylenol or motrin for pain.  Please continue throat spray and warm salt water gargles.  If not improving or you have worsening symptoms into next week then please return for re-evaluation.

## 2022-07-20 NOTE — ED Provider Notes (Signed)
[facility]    CSN: [id number] Arrival date & time: 07/20/22  0857      History   Chief Complaint Chief Complaint  Patient presents with   Sore Throat    HPI Carrie Krueger is a 36 y.o. female.   Patient is here for sharp pains in her throat when she swallows.  This started yesterday.  No runny nose, congestion or drainage.   No cough or fevers.  Slight headache yesterday but that resolved.  No n/v.  She has been spraying throat spray, hot tea, gargling with warm water.   There is flu in the house.       Past Medical History:  Diagnosis Date   Headache    Hx of varicella    Medical history non-contributory     Patient Active Problem List   Diagnosis Date Noted   Post term pregnancy, antepartum condition or complication 40/34/7425   Vaginal delivery 01/14/2016   Positive GBS test 01/13/2016   Sickle cell trait ([place]) 01/13/2016   Post term pregnancy 01/13/2016   Multiple skin nodules 01/06/2014    Past Surgical History:  Procedure Laterality Date   NO PAST SURGERIES      OB History     Gravida  3   Para  2   Term  2   Preterm      AB  1   Living  2      SAB  1   IAB      Ectopic      Multiple  0   Live Births  2            Home Medications    Prior to Admission medications   Medication Sig Start Date End Date Taking? Authorizing Provider  levonorgestrel (MIRENA, 52 MG,) 20 MCG/24HR IUD 1 Intra Uterine Device.    [provider]  Menthol (BIOFREEZE) 5 % PTCH Apply 1 patch topically daily as needed (back pain). Patient not taking: Reported on 07/20/2022    [provider]  methocarbamol (ROBAXIN) 500 MG tablet Take 1 tablet (500 mg total) by mouth every 8 (eight) hours as needed for muscle spasms. Patient not taking: Reported on 07/20/2022 10/30/21   Davonna Belling, MD  Multiple Vitamin (MULTIVITAMIN) tablet Take 1 tablet by mouth daily.    [provider]    Family History Family  History  Problem Relation Age of Onset   Sickle cell anemia Maternal Grandmother     Social History Social History   Tobacco Use   Smoking status: Former    Packs/day: 0.50    Types: Cigarettes  Substance Use Topics   Alcohol use: Yes    Comment: occ   Drug use: No     Allergies   Bactrim [sulfamethoxazole-trimethoprim] and Tomato   Review of Systems Review of Systems  Constitutional:  Negative for chills, fatigue and fever.  HENT:  Positive for sore throat. Negative for congestion and rhinorrhea.   Respiratory: Negative.    Cardiovascular: Negative.   Gastrointestinal: Negative.   Genitourinary: Negative.   Musculoskeletal: Negative.   Psychiatric/Behavioral: Negative.       Physical Exam Triage Vital Signs ED Triage Vitals  Enc Vitals Group     BP 07/20/22 0914 113/85     Pulse Rate 07/20/22 0914 79     Resp 07/20/22 0914 14     Temp 07/20/22 0914 98.6 F (37 C)     Temp Source 07/20/22 0914 Oral  SpO2 07/20/22 0914 100 %     Weight --      Height --      Head Circumference --      Peak Flow --      Pain Score 07/20/22 0916 10     Pain Loc --      Pain Edu? --      Excl. in [place]? --    No data found.  Updated Vital Signs BP 113/85 (BP Location: Left Arm)   Pulse 79   Temp 98.6 F (37 C) (Oral)   Resp 14   SpO2 100%   Visual Acuity Right Eye Distance:   Left Eye Distance:   Bilateral Distance:    Right Eye Near:   Left Eye Near:    Bilateral Near:     Physical Exam Constitutional:      Appearance: She is well-developed.  HENT:     Head: Normocephalic and atraumatic.     Nose: Rhinorrhea present. No congestion.     Mouth/Throat:     Mouth: Mucous membranes are moist.     Pharynx: Oropharynx is clear. No pharyngeal swelling, oropharyngeal exudate or posterior oropharyngeal erythema.     Tonsils: No tonsillar exudate.  Cardiovascular:     Rate and Rhythm: Normal rate and regular rhythm.     Heart sounds: Normal heart sounds.   Pulmonary:     Effort: Pulmonary effort is normal.  Musculoskeletal:     Cervical back: Normal range of motion and neck supple.  Lymphadenopathy:     Cervical: No cervical adenopathy.  Skin:    General: Skin is warm.  Neurological:     General: No focal deficit present.     Mental Status: She is alert.  Psychiatric:        Mood and Affect: Mood normal.     UC Treatments / Results  Labs (all labs ordered are listed, but only abnormal results are displayed) Labs Reviewed  CULTURE, GROUP A STREP [facility])  POCT RAPID STREP A, ED / UC    EKG   Radiology No results found.  Procedures Procedures (including critical care time)  Medications Ordered in UC Medications - No data to display  Initial Impression / Assessment and Plan / UC Course  I have reviewed the triage vital signs and the nursing notes.  Pertinent labs & imaging results that were available during my care of the patient were reviewed by me and considered in my medical decision making (see chart for details).   Final Clinical Impressions(s) / UC Diagnoses   Final diagnoses:  Viral pharyngitis     Discharge Instructions      You were seen today for sore throat.  Your strep test was negative in the office.  I have sent the swab for culture and this will be resulted in several days.  If this ends up being positive then we will call you to start antibiotics.  In the mean time this seems viral to me.  I recommend tylenol or motrin for pain.  Please continue throat spray and warm salt water gargles.  If not improving or you have worsening symptoms into next week then please return for re-evaluation.      ED Prescriptions   None    PDMP not reviewed this encounter.   Rondel Oh, MD 07/20/22 [phone]

## 2022-07-20 NOTE — ED Triage Notes (Signed)
Pt presents with c/o sore throat that began yesterday morning. Pt denies fever.

## 2022-07-22 LAB — CULTURE, GROUP A STREP (THRC)

## 2022-10-23 IMAGING — CT CT ABD-PELV W/ CM
2 of 4 series · 15 of 46 positions shown, 17 images · IV contrast (omnipaque)
Comparison: None

CLINICAL DATA: Right lower quadrant pain for several hours

EXAM:
CT ABDOMEN AND PELVIS WITH CONTRAST
TECHNIQUE: Multidetector CT imaging of the abdomen and pelvis was performed
using the standard protocol following bolus administration of
intravenous contrast.
CONTRAST:  100mL OMNIPAQUE IOHEXOL 300 MG/ML  SOLN

[Series 3: a/p w/ 5mm · axial · 0.66mm/px · z∈[+907,+1297]mm · 12 of 86 slices shown, 14 images]
[im 4/86  soft-tissue]
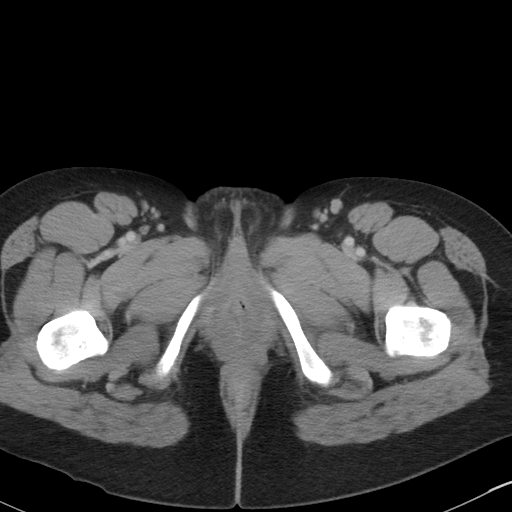
[im 4/86  bone]
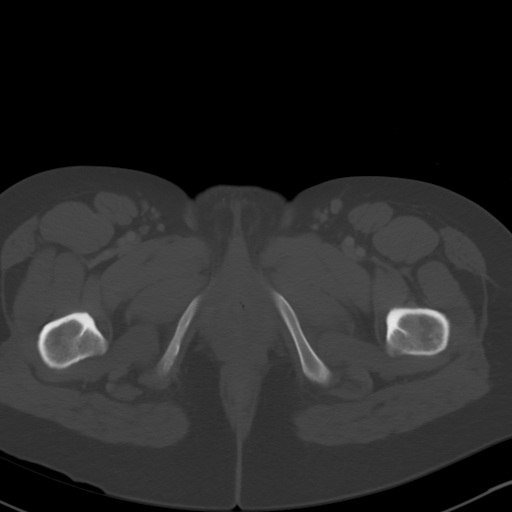
[im 11/86  soft-tissue]
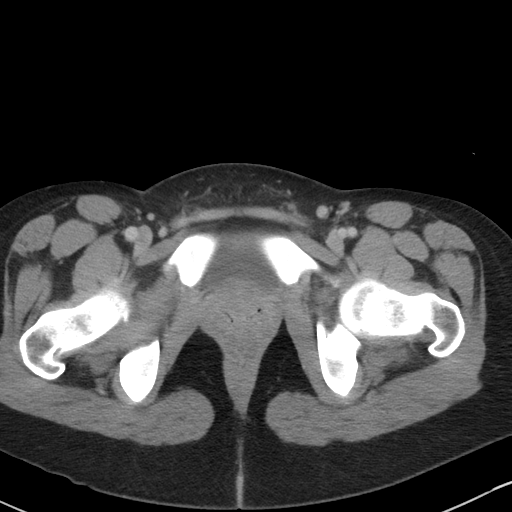
[im 18/86  soft-tissue]
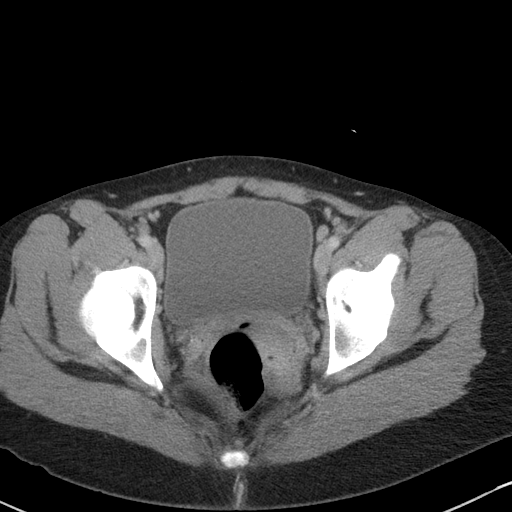
[im 25/86  soft-tissue]
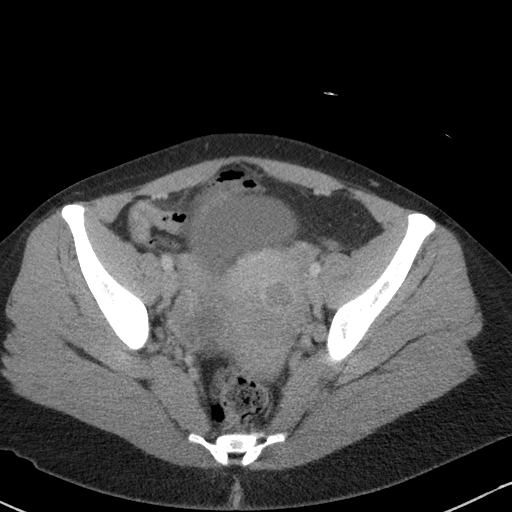
[im 32/86  soft-tissue]
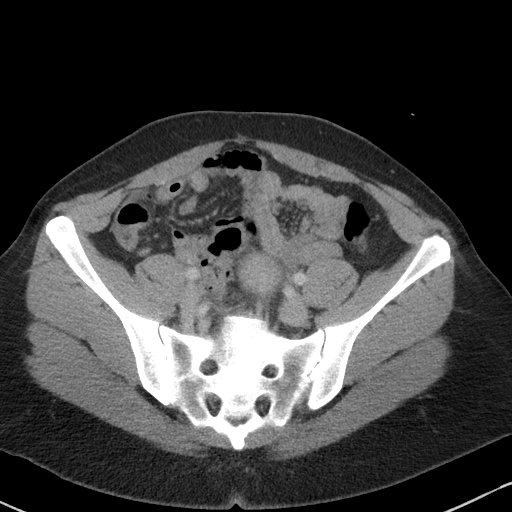
[im 39/86  soft-tissue]
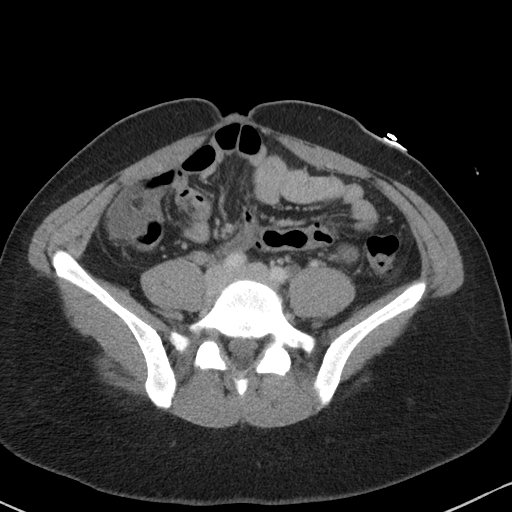
[im 47/86  soft-tissue]
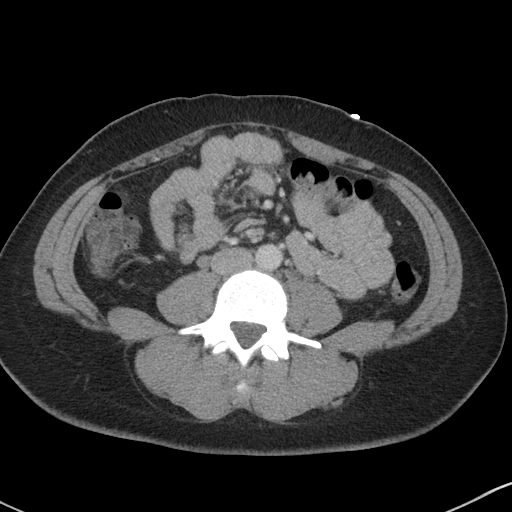
[im 54/86  soft-tissue]
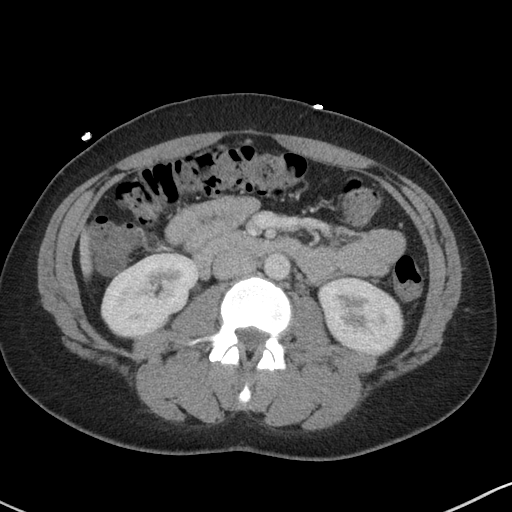
[im 61/86  soft-tissue]
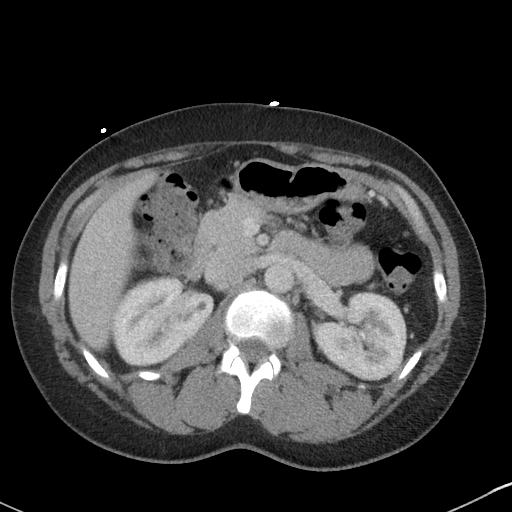
[im 61/86  bone]
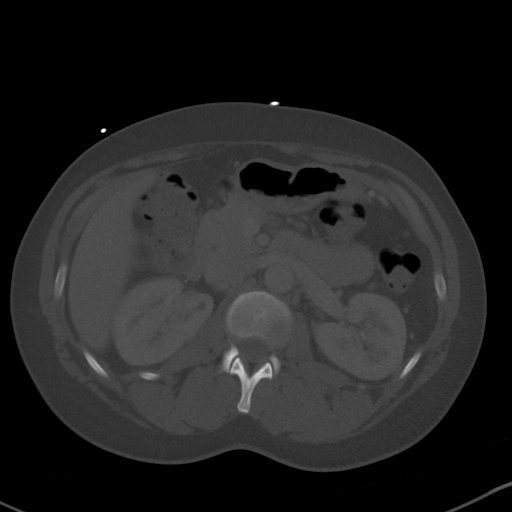
[im 68/86  soft-tissue]
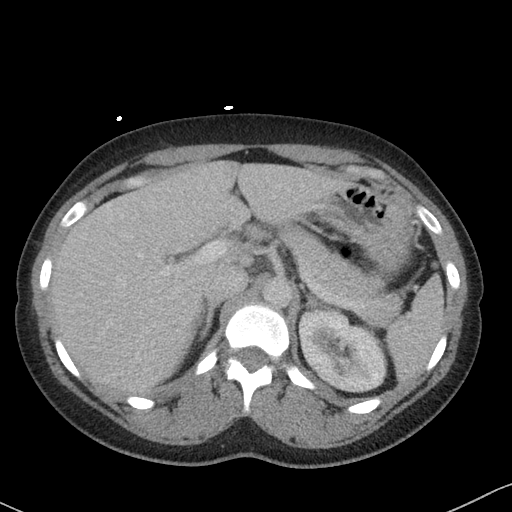
[im 75/86  soft-tissue]
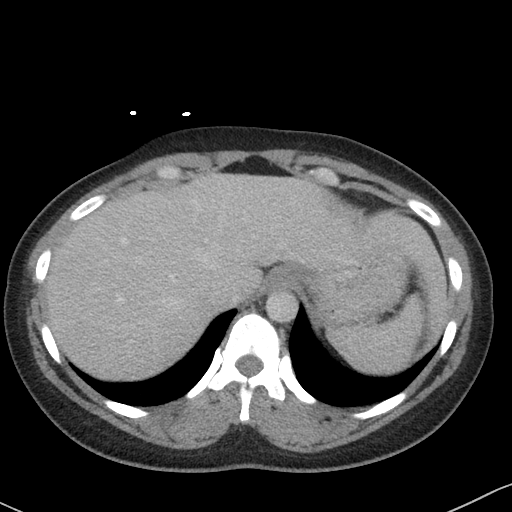
[im 82/86  soft-tissue]
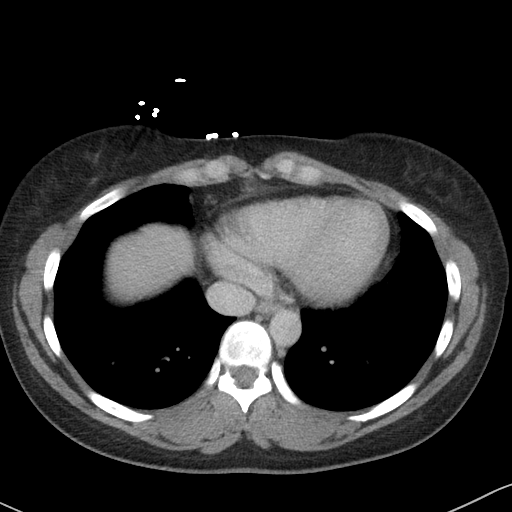

[Series 6: a/p w/ cor · coronal · 0.62mm/px · 3 of 129 slices shown]
[im 43/129  soft-tissue]
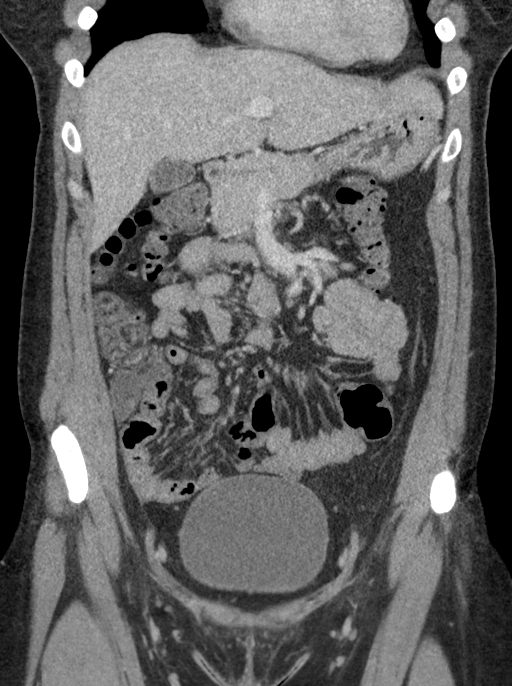
[im 57/129  soft-tissue]
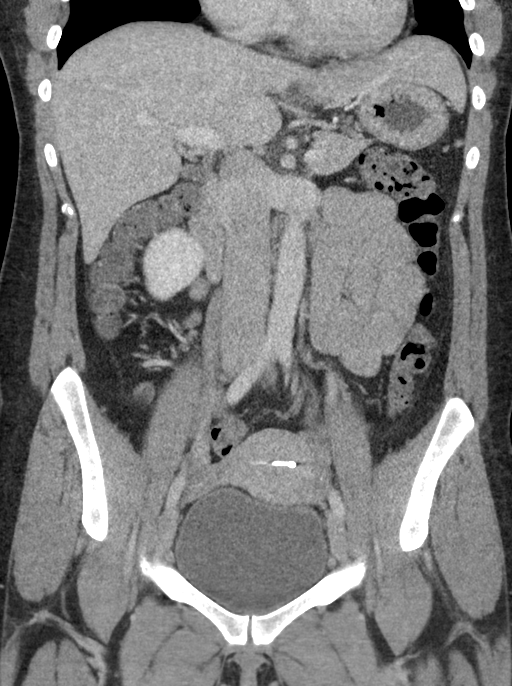
[im 72/129  soft-tissue]
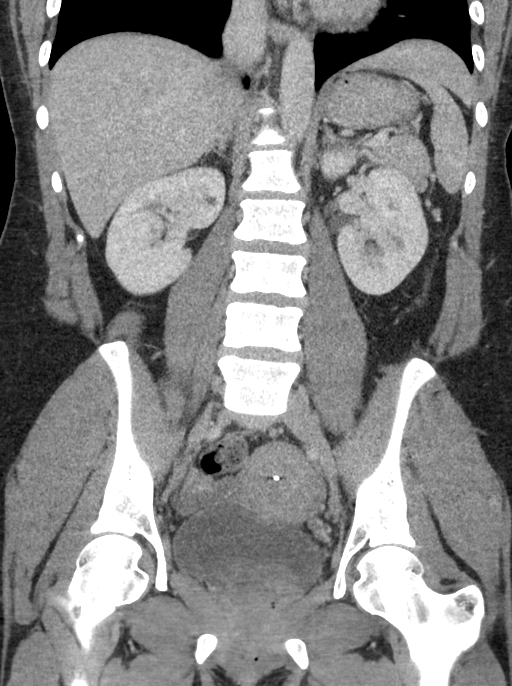

[15 of 46 positions shown; findings below may reference images not displayed]

FINDINGS: Lower chest: No acute abnormality.

Hepatobiliary: No focal liver abnormality is seen. No gallstones,
gallbladder wall thickening, or biliary dilatation.

Pancreas: Unremarkable. No pancreatic ductal dilatation or
surrounding inflammatory changes.

Spleen: Normal in size without focal abnormality.

Adrenals/Urinary Tract: Adrenal glands are within normal limits.
Kidneys demonstrate a normal enhancement pattern bilaterally. No
renal calculi or obstructive changes are seen. Mild fullness of the
left ureter is noted likely related to extrinsic compression from
the uterus as well as a distended bladder. The bladder is within
normal limits.

Stomach/Bowel: The appendix is well visualized and within normal
limits. No appendiceal dilatation or periappendiceal inflammatory
changes are noted to suggest appendicitis. Small bowel and stomach
are within normal limits. Colon shows no focal abnormality.

Vascular/Lymphatic: No significant vascular findings are present. No
enlarged abdominal or pelvic lymph nodes.

Reproductive: Uterus shows a 2.4 cm peripherally enhancing
hypodensity consistent with uterine fibroid. This lies eccentric to
the left in the anterior wall of the uterus. IUD is noted in place.
No ovarian abnormality is seen.

Other: No abdominal wall hernia or abnormality. No abdominopelvic
ascites.

Musculoskeletal: No acute or significant osseous findings.
IMPRESSION: Normal-appearing appendix.

Uterine fibroid without complicating factors.

Mild fullness of the left renal collecting system and ureter without
obstructing stone. This is felt to be related to distended bladder
and mild extrinsic compression by the uterus deviated to the left.

## 2023-05-24 ENCOUNTER — Emergency Department (HOSPITAL_COMMUNITY): Payer: 59

## 2023-05-24 ENCOUNTER — Emergency Department (HOSPITAL_COMMUNITY)
Admission: EM | Admit: 2023-05-24 | Discharge: 2023-05-25 | Disposition: A | Discharge status: Home / Self Care | Payer: 59 | Attending: Emergency Medicine | Admitting: Emergency Medicine

## 2023-05-24 ENCOUNTER — Ambulatory Visit (HOSPITAL_COMMUNITY)
Admission: EM | Admit: 2023-05-24 | Discharge: 2023-05-24 | Disposition: A | Discharge status: Home / Self Care | Payer: 59 | Attending: Internal Medicine | Admitting: Internal Medicine

## 2023-05-24 ENCOUNTER — Encounter (HOSPITAL_COMMUNITY): Payer: Self-pay

## 2023-05-24 ENCOUNTER — Encounter (HOSPITAL_COMMUNITY): Payer: Self-pay | Admitting: Emergency Medicine

## 2023-05-24 DIAGNOSIS — S022XXA Fracture of nasal bones, initial encounter for closed fracture: Secondary | ICD-10-CM | POA: Insufficient documentation

## 2023-05-24 DIAGNOSIS — S0011XA Contusion of right eyelid and periocular area, initial encounter: Secondary | ICD-10-CM | POA: Diagnosis not present

## 2023-05-24 DIAGNOSIS — S0993XA Unspecified injury of face, initial encounter: Secondary | ICD-10-CM

## 2023-05-24 LAB — HCG, SERUM, QUALITATIVE: Preg, Serum: NEGATIVE
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
## 2023-05-24 NOTE — Discharge Instructions (Addendum)
Rectally to the emergency department for further evaluation
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
## 2023-05-25 NOTE — ED Notes (Signed)
Pt reported blurred vision to the right eye during exam and normally wears glasses for driving. Did not have glasses available during exam.
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
## 2023-06-08 ENCOUNTER — Emergency Department (HOSPITAL_BASED_OUTPATIENT_CLINIC_OR_DEPARTMENT_OTHER)
Admission: EM | Admit: 2023-06-08 | Discharge: 2023-06-08 | Disposition: A | Discharge status: Home / Self Care | Payer: 59 | Attending: Emergency Medicine | Admitting: Emergency Medicine

## 2023-06-08 ENCOUNTER — Encounter (HOSPITAL_BASED_OUTPATIENT_CLINIC_OR_DEPARTMENT_OTHER): Payer: Self-pay | Admitting: Emergency Medicine

## 2023-06-08 ENCOUNTER — Emergency Department (HOSPITAL_BASED_OUTPATIENT_CLINIC_OR_DEPARTMENT_OTHER): Payer: 59

## 2023-06-08 DIAGNOSIS — S2231XA Fracture of one rib, right side, initial encounter for closed fracture: Secondary | ICD-10-CM | POA: Diagnosis not present

## 2023-06-08 DIAGNOSIS — D259 Leiomyoma of uterus, unspecified: Secondary | ICD-10-CM | POA: Insufficient documentation

## 2023-06-08 DIAGNOSIS — R0789 Other chest pain: Secondary | ICD-10-CM | POA: Diagnosis present

## 2023-06-08 LAB — COMPREHENSIVE METABOLIC PANEL
ALT: 15 U/L (ref 0–44)
AST: 16 U/L (ref 15–41)
Albumin: 4.2 g/dL (ref 3.5–5.0)
Alkaline Phosphatase: 54 U/L (ref 38–126)
Anion gap: 8 (ref 5–15)
BUN: 11 mg/dL (ref 6–20)
CO2: 29 mmol/L (ref 22–32)
Calcium: 9.8 mg/dL (ref 8.9–10.3)
Chloride: 101 mmol/L (ref 98–111)
Creatinine, Ser: 0.66 mg/dL (ref 0.44–1.00)
GFR, Estimated: >60 mL/min (ref >60)
Glucose, Bld: 100 mg/dL — ABNORMAL HIGH (ref 70–99)
Potassium: 3.5 mmol/L (ref 3.5–5.1)
Sodium: 138 mmol/L (ref 135–145)
Total Bilirubin: 1.1 mg/dL (ref 0.3–1.2)
Total Protein: 6.7 g/dL (ref 6.5–8.1)

## 2023-06-08 LAB — CBC
HCT: 42.6 % (ref 36.0–46.0)
Hemoglobin: 15 g/dL (ref 12.0–15.0)
MCH: 28.4 pg (ref 26.0–34.0)
MCHC: 35.2 g/dL (ref 30.0–36.0)
MCV: 80.7 fL (ref 80.0–100.0)
Platelets: 284 10*3/uL (ref 150–400)
RBC: 5.28 MIL/uL — ABNORMAL HIGH (ref 3.87–5.11)
RDW: 14.4 % (ref 11.5–15.5)
WBC: 6.2 10*3/uL (ref 4.0–10.5)
nRBC: 0 % (ref 0.0–0.2)

## 2023-06-08 LAB — HCG, SERUM, QUALITATIVE: Preg, Serum: NEGATIVE

## 2023-06-08 MED ORDER — IOHEXOL 300 MG/ML  SOLN
100.0000 mL | Freq: Once | INTRAMUSCULAR | Status: AC | PRN
  Administered 2023-06-08: 80 mL via INTRAVENOUS

## 2023-06-08 MED ORDER — KETOROLAC TROMETHAMINE 15 MG/ML IJ SOLN
15.0000 mg | Freq: Once | INTRAMUSCULAR | Status: AC
  Administered 2023-06-08: 15 mg via INTRAVENOUS
  Filled 2023-06-08: qty 1

## 2023-06-08 MED ORDER — OXYCODONE-ACETAMINOPHEN 5-325 MG PO TABS
1.0000 | ORAL_TABLET | Freq: Four times a day (QID) | ORAL | 0 refills | Status: AC | PRN
Start: 2023-06-08 — End: ?

## 2023-06-08 NOTE — ED Notes (Signed)
 RN reviewed discharge instructions with pt. Pt verbalized understanding and had no further questions. VSS upon discharge.  
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
## 2023-11-15 IMAGING — CR DG CHEST 2V
2 series · 2 of 2 positions shown · non-contrast
Comparison: January 02, 2014.

CLINICAL DATA: chest pain

EXAM:
CHEST - 2 VIEW

[chest pa]
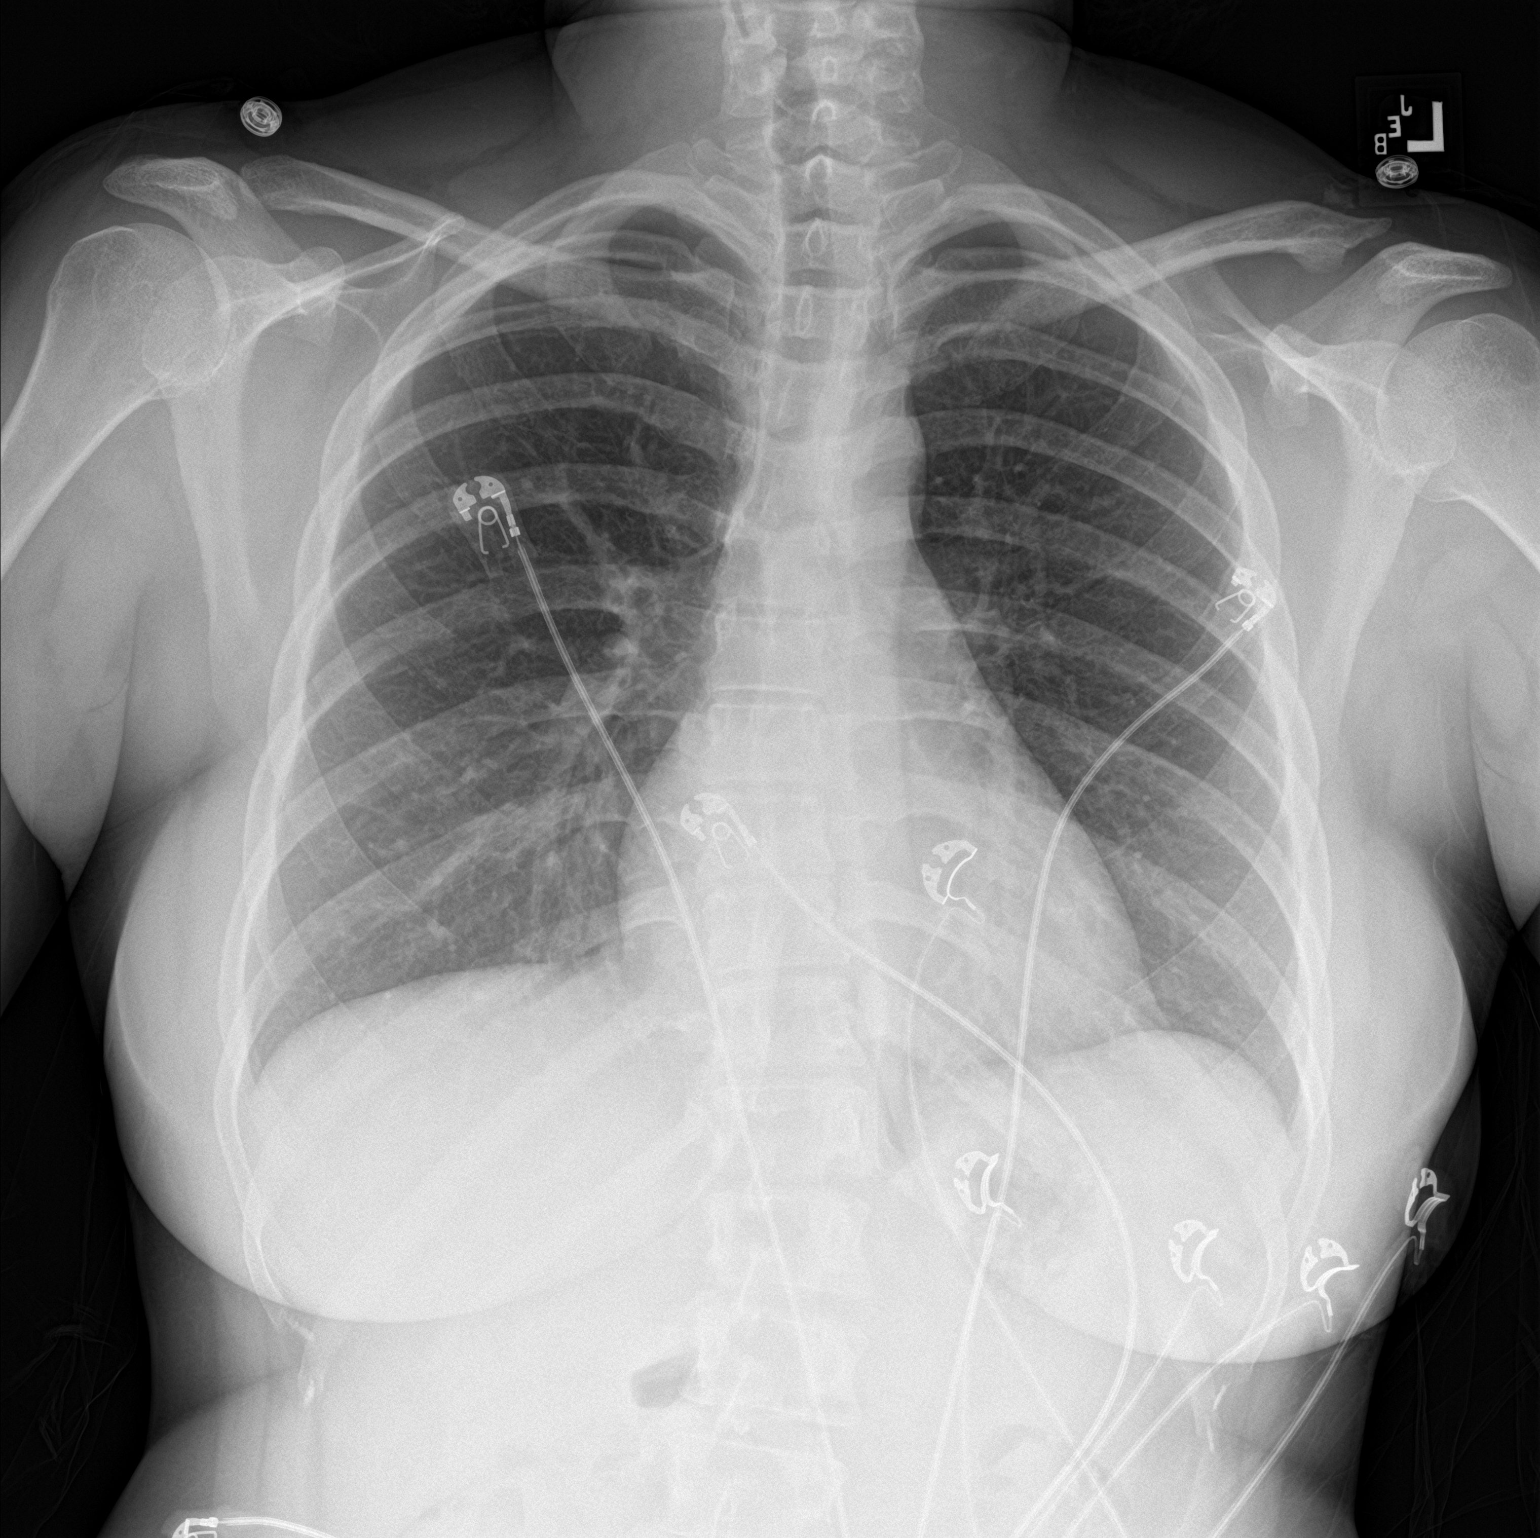

[chest lat]
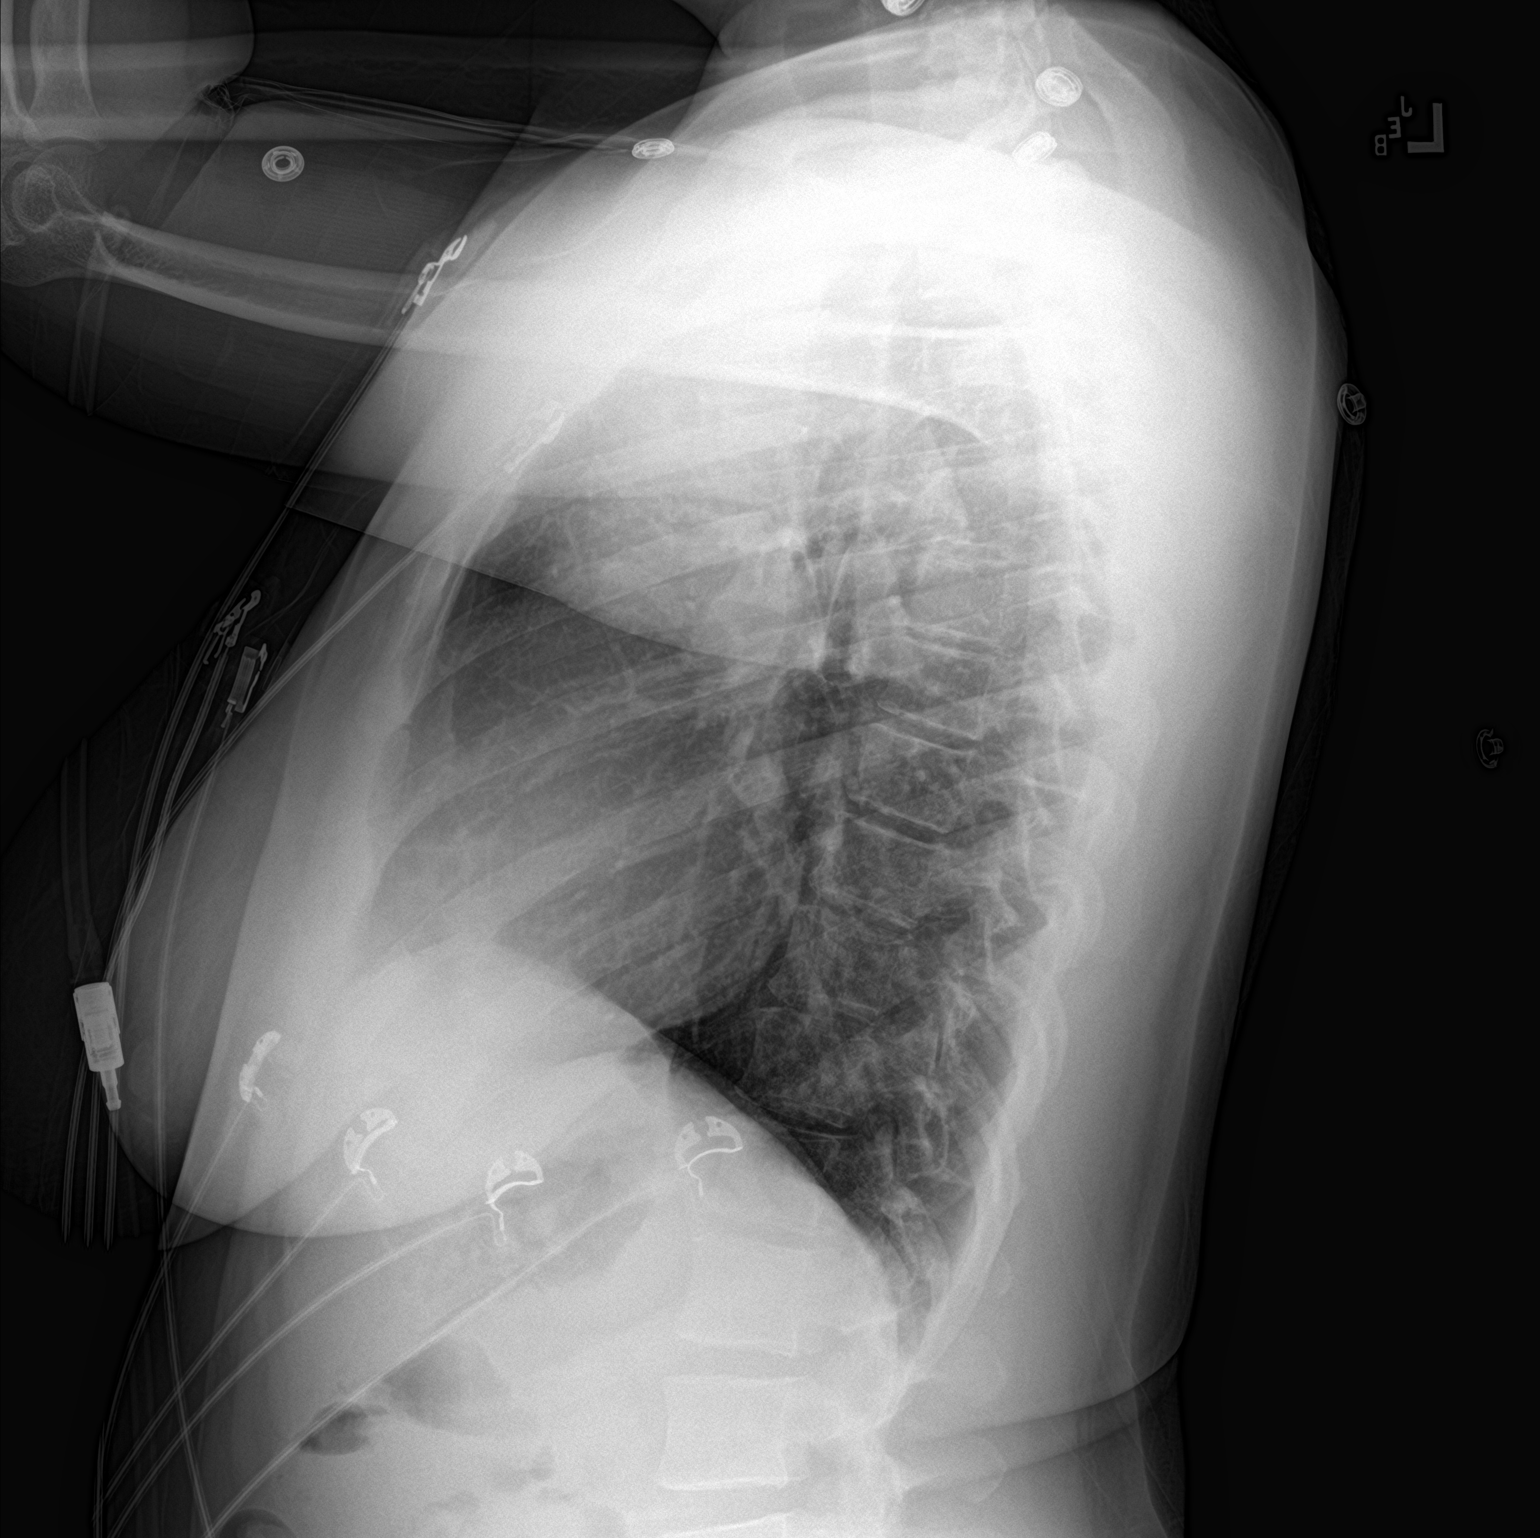

[2 of 2 positions shown; findings below may reference images not displayed]

FINDINGS: No consolidation. No visible pleural effusions or pneumothorax.
Cardiomediastinal silhouette is within normal limits. No evidence of
acute osseous abnormality.
IMPRESSION: No active cardiopulmonary disease.

## 2024-04-30 ENCOUNTER — Encounter (HOSPITAL_COMMUNITY): Payer: Self-pay

## 2024-04-30 ENCOUNTER — Other Ambulatory Visit: Payer: Self-pay

## 2024-04-30 ENCOUNTER — Ambulatory Visit (HOSPITAL_COMMUNITY): Admission: EM | Admit: 2024-04-30 | Discharge: 2024-04-30 | Disposition: A | Discharge status: Home / Self Care

## 2024-04-30 ENCOUNTER — Emergency Department (HOSPITAL_COMMUNITY)
Admission: EM | Admit: 2024-04-30 | Discharge: 2024-04-30 | Disposition: A | Discharge status: Home / Self Care | Attending: Emergency Medicine | Admitting: Emergency Medicine

## 2024-04-30 ENCOUNTER — Emergency Department (HOSPITAL_COMMUNITY)

## 2024-04-30 ENCOUNTER — Encounter (HOSPITAL_COMMUNITY): Payer: Self-pay | Admitting: Emergency Medicine

## 2024-04-30 DIAGNOSIS — R519 Headache, unspecified: Secondary | ICD-10-CM

## 2024-04-30 DIAGNOSIS — H5711 Ocular pain, right eye: Secondary | ICD-10-CM

## 2024-04-30 DIAGNOSIS — S0231XA Fracture of orbital floor, right side, initial encounter for closed fracture: Secondary | ICD-10-CM | POA: Insufficient documentation

## 2024-04-30 DIAGNOSIS — S0591XA Unspecified injury of right eye and orbit, initial encounter: Secondary | ICD-10-CM | POA: Diagnosis present

## 2024-04-30 MED ORDER — ERYTHROMYCIN 5 MG/GM OP OINT: TOPICAL_OINTMENT | OPHTHALMIC | 0 refills | Status: AC

## 2024-04-30 MED ORDER — TETRACAINE HCL 0.5 % OP SOLN
2.0000 [drp] | Freq: Once | OPHTHALMIC | Status: AC
  Administered 2024-04-30: 2 [drp] via OPHTHALMIC
  Filled 2024-04-30: qty 4

## 2024-04-30 MED ORDER — FLUORESCEIN SODIUM 1 MG OP STRP
1.0000 | ORAL_STRIP | Freq: Once | OPHTHALMIC | Status: AC
  Administered 2024-04-30: 1 via OPHTHALMIC
  Filled 2024-04-30: qty 1
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
## 2024-04-30 NOTE — Discharge Instructions (Addendum)
 Please follow-up with the eye doctor in clinic.  Call them tomorrow to try and set up an appointment.  I have attached information for the facial surgeon that is on-call today.  If the eye doctor thinks that this needs to be evaluated for possible fixation and then you may need to call him to set up an appointment as well.  Take 4 over the counter ibuprofen  tablets 3 times a day or 2 over-the-counter naproxen tablets twice a day for pain. Also take tylenol  1000mg (2 extra strength) four times a day.
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
## 2024-04-30 NOTE — ED Notes (Signed)
Visual acuity right eye 20/50 left eye 20/20
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
## 2024-06-08 ENCOUNTER — Other Ambulatory Visit (HOSPITAL_COMMUNITY)
Admission: RE | Admit: 2024-06-08 | Discharge: 2024-06-08 | Disposition: A | Discharge status: Home / Self Care | Source: Ambulatory Visit | Attending: Certified Nurse Midwife | Admitting: Certified Nurse Midwife

## 2024-06-08 ENCOUNTER — Encounter (HOSPITAL_BASED_OUTPATIENT_CLINIC_OR_DEPARTMENT_OTHER): Payer: Self-pay | Admitting: Certified Nurse Midwife

## 2024-06-08 ENCOUNTER — Ambulatory Visit (HOSPITAL_BASED_OUTPATIENT_CLINIC_OR_DEPARTMENT_OTHER): Admitting: Certified Nurse Midwife

## 2024-06-08 VITALS — BP 104/64 | HR 76 | Ht 65.0 in | Wt 154.8 lb

## 2024-06-08 DIAGNOSIS — Z01419 Encounter for gynecological examination (general) (routine) without abnormal findings: Secondary | ICD-10-CM

## 2024-06-08 DIAGNOSIS — Z113 Encounter for screening for infections with a predominantly sexual mode of transmission: Secondary | ICD-10-CM | POA: Insufficient documentation

## 2024-06-08 DIAGNOSIS — Z124 Encounter for screening for malignant neoplasm of cervix: Secondary | ICD-10-CM

## 2024-06-08 DIAGNOSIS — Z30432 Encounter for removal of intrauterine contraceptive device: Secondary | ICD-10-CM

## 2024-06-08 DIAGNOSIS — Z30431 Encounter for routine checking of intrauterine contraceptive device: Secondary | ICD-10-CM

## 2024-06-08 DIAGNOSIS — Z3169 Encounter for other general counseling and advice on procreation: Secondary | ICD-10-CM

## 2024-06-08 MED ORDER — PRENATAL VITAMIN 27-0.8 MG PO TABS: 1.0000 | ORAL_TABLET | Freq: Every day | ORAL | 3 refills | Status: AC

## 2024-06-09 ENCOUNTER — Encounter (HOSPITAL_BASED_OUTPATIENT_CLINIC_OR_DEPARTMENT_OTHER): Payer: Self-pay | Admitting: Certified Nurse Midwife

## 2024-06-09 NOTE — Progress Notes (Signed)
 38 y.o. [name] Single Black or African American female here for annual exam.    No LMP recorded. (Menstrual status: IUD).          Sexually active: Yes.    The current method of family planning is IUD.    Exercising: Yes.     Smoker:  no  Health Maintenance: Pap:  Collected History of abnormal Pap:  denies MMG:  Discussed mammogram annually starting at age 89    reports that she has quit smoking. Her smoking use included cigarettes. She has quit using smokeless tobacco. She reports current alcohol use. She reports current drug use. Drug: Marijuana.  Past Medical History:  Diagnosis Date   Headache    Hx of varicella    Medical history non-contributory     Past Surgical History:  Procedure Laterality Date   cyst removal     neck   NO PAST SURGERIES      Current Outpatient Medications  Medication Sig Dispense Refill   levonorgestrel (MIRENA, 52 MG,) 20 MCG/24HR IUD 1 Intra Uterine Device.     Multiple Vitamin (MULTIVITAMIN) tablet Take 1 tablet by mouth daily.     Prenatal Vit-Fe Fumarate-FA (PRENATAL VITAMIN) 27-0.8 MG TABS Take 1 each by mouth daily. 100 tablet 3   erythromycin  ophthalmic ointment Place a 1/2 inch ribbon of ointment into the lower eyelid four times a day. 3.5 g 0   Menthol  (BIOFREEZE) 5 % PTCH Apply 1 patch topically daily as needed (back pain). (Patient not taking: Reported on 07/20/2022)     methocarbamol  (ROBAXIN ) 500 MG tablet Take 1 tablet (500 mg total) by mouth every 8 (eight) hours as needed for muscle spasms. (Patient not taking: Reported on 07/20/2022) 8 tablet 0   oxyCODONE -acetaminophen  (PERCOCET/ROXICET) 5-325 MG tablet Take 1 tablet by mouth every 6 (six) hours as needed for severe pain (pain score 7-10). 8 tablet 0   No current facility-administered medications for this visit.    Family History  Problem Relation Age of Onset   Sickle cell anemia Maternal Grandmother     ROS: Constitutional: negative Genitourinary:negative  Exam:    BP 104/64   Pulse 76   Ht 5' 5 (1.651 m) Comment: Reported  Wt 154 lb 12.8 oz (70.2 kg)   BMI 25.76 kg/m   Height: 5' 5 (165.1 cm) (Reported)  General appearance: alert, cooperative and appears stated age Head: Normocephalic, without obvious abnormality, atraumatic Neck: no adenopathy, supple, symmetrical, trachea midline and thyroid normal to inspection and palpation Lungs: clear to auscultation bilaterally Breasts: normal appearance, no masses or tenderness, Inspection negative, No nipple retraction or dimpling, No nipple discharge or bleeding, No axillary or supraclavicular adenopathy, Normal to palpation without dominant masses Heart: regular rate and rhythm Abdomen: soft, non-tender; bowel sounds normal; no masses,  no organomegaly Extremities: extremities normal, atraumatic, no cyanosis or edema Skin: Skin color, texture, turgor normal. No rashes or lesions Lymph nodes: Cervical, supraclavicular, and axillary nodes normal. No abnormal inguinal nodes palpated Neurologic: Grossly normal   Pelvic: External genitalia:  no lesions              Urethra:  normal appearing urethra with no masses, tenderness or lesions              Bartholins and Skenes: normal                 Vagina: normal appearing vagina with normal color and no discharge, no lesions  Cervix: multiparous appearance, no bleeding following Pap, no cervical motion tenderness, and no lesions              Pap taken: Yes.   Bimanual Exam:  Uterus:  normal size, contour, position, consistency, mobility, non-tender              Adnexa: no mass, fullness, tenderness               Rectovaginal: Confirms               Anus:  normal sphincter tone, no lesions  Chaperone,  CMA, was present for exam.  Assessment/Plan:   1. Encounter for annual routine gynecological examination (Primary) - Breast self awareness encouraged  2. Cervical cancer screening - Cytology - PAP( [place])  3. Contraceptive,  surveillance, intrauterine device - Pt desires removal of IUD today - Cytology - PAP( [place])  4. Screen for STD (sexually transmitted disease) - Cytology - PAP( [place])  5. Removal of contraceptive intrauterine device (IUD) performed - Prenatal Vit-Fe Fumarate-FA (PRENATAL VITAMIN) 27-0.8 MG TABS; Take 1 each by mouth daily.  Dispense: 100 tablet; Refill: 3  6. Encounter for preconception consultation - Start prenatal vitamins.       GYNECOLOGY CLINIC PROCEDURE NOTE  Carrie Krueger is a 38 y.o. [phone] here for Mirena IUD removal.   IUD Removal  Patient was in the dorsal lithotomy position, normal external genitalia was noted.  A speculum was placed in the patient's vagina, normal discharge was noted, no lesions. The multiparous cervix was visualized, no lesions, no abnormal discharge.  The strings of the IUD were grasped and pulled using ring forceps. The IUD was removed in its entirety. Patient tolerated the procedure well.     [name], CNM 8:24 AM

## 2024-06-11 LAB — CYTOLOGY - PAP
Chlamydia: NEGATIVE
Comment: NEGATIVE
Comment: NEGATIVE
Comment: NEGATIVE
Comment: NORMAL
Diagnosis: UNDETERMINED — AB
High risk HPV: NEGATIVE
Neisseria Gonorrhea: NEGATIVE
Trichomonas: NEGATIVE

## 2024-06-14 ENCOUNTER — Ambulatory Visit (HOSPITAL_BASED_OUTPATIENT_CLINIC_OR_DEPARTMENT_OTHER): Payer: Self-pay | Admitting: Obstetrics & Gynecology
